# Patient Record
Sex: Male | Born: 1965 | Race: White | Hispanic: No | Marital: Married | Smoking: Former smoker
Health system: Southern US, Community
[De-identification: ages and names within clinical notes are randomized; demographics above are authoritative.]

## PROBLEM LIST (undated history)

## (undated) DIAGNOSIS — E119 Type 2 diabetes mellitus without complications: Secondary | ICD-10-CM

## (undated) DIAGNOSIS — T148XXA Other injury of unspecified body region, initial encounter: Secondary | ICD-10-CM

## (undated) DIAGNOSIS — I10 Essential (primary) hypertension: Secondary | ICD-10-CM

## (undated) HISTORY — PX: COLONOSCOPY W/ BIOPSIES AND POLYPECTOMY: SHX1376

## (undated) HISTORY — PX: WISDOM TOOTH EXTRACTION: SHX21

## (undated) HISTORY — DX: Type 2 diabetes mellitus without complications: E11.9

## (undated) HISTORY — PX: VASECTOMY: SHX75

## (undated) HISTORY — DX: Essential (primary) hypertension: I10

---

## 2004-04-20 ENCOUNTER — Emergency Department (HOSPITAL_COMMUNITY): Admission: EM | Admit: 2004-04-20 | Discharge: 2004-04-20 | Payer: Self-pay | Admitting: Family Medicine

## 2009-08-12 ENCOUNTER — Encounter: Admission: RE | Admit: 2009-08-12 | Discharge: 2009-08-12 | Payer: Self-pay | Admitting: Family Medicine

## 2015-08-02 ENCOUNTER — Other Ambulatory Visit: Payer: Self-pay | Admitting: Family Medicine

## 2015-08-02 ENCOUNTER — Ambulatory Visit
Admission: RE | Admit: 2015-08-02 | Discharge: 2015-08-02 | Disposition: A | Discharge status: Home / Self Care | Payer: Self-pay | Source: Ambulatory Visit | Attending: Family Medicine | Admitting: Family Medicine

## 2015-08-02 DIAGNOSIS — R0600 Dyspnea, unspecified: Secondary | ICD-10-CM

## 2015-08-02 DIAGNOSIS — R0609 Other forms of dyspnea: Principal | ICD-10-CM

## 2016-06-16 ENCOUNTER — Ambulatory Visit: Payer: Self-pay | Admitting: Allergy

## 2016-06-29 ENCOUNTER — Ambulatory Visit (INDEPENDENT_AMBULATORY_CARE_PROVIDER_SITE_OTHER): Payer: 59 | Admitting: Allergy

## 2016-06-29 ENCOUNTER — Encounter (INDEPENDENT_AMBULATORY_CARE_PROVIDER_SITE_OTHER): Payer: Self-pay

## 2016-06-29 ENCOUNTER — Encounter: Payer: Self-pay | Admitting: Allergy

## 2016-06-29 VITALS — BP 132/86 | HR 88 | Temp 97.9°F | Resp 16 | Ht 68.0 in | Wt 229.0 lb

## 2016-06-29 DIAGNOSIS — L509 Urticaria, unspecified: Secondary | ICD-10-CM

## 2016-06-29 LAB — CBC WITH DIFFERENTIAL/PLATELET
BASOS ABS: 0 {cells}/uL (ref 0–200)
Basophils Relative: 0 %
EOS PCT: 1 %
Eosinophils Absolute: 60 cells/uL (ref 15–500)
HCT: 49 % (ref 38.5–50.0)
HEMOGLOBIN: 16.4 g/dL (ref 13.2–17.1)
LYMPHS ABS: 1380 {cells}/uL (ref 850–3900)
Lymphocytes Relative: 23 %
MCH: 29.9 pg (ref 27.0–33.0)
MCHC: 33.5 g/dL (ref 32.0–36.0)
MCV: 89.3 fL (ref 80.0–100.0)
MONOS PCT: 10 %
MPV: 9.2 fL (ref 7.5–12.5)
Monocytes Absolute: 600 cells/uL (ref 200–950)
NEUTROS ABS: 3960 {cells}/uL (ref 1500–7800)
Neutrophils Relative %: 66 %
PLATELETS: 322 10*3/uL (ref 140–400)
RBC: 5.49 MIL/uL (ref 4.20–5.80)
RDW: 13.3 % (ref 11.0–15.0)
WBC: 6 10*3/uL (ref 3.8–10.8)

## 2016-06-29 LAB — COMPLETE METABOLIC PANEL WITH GFR
ALT: 28 U/L (ref 9–46)
AST: 20 U/L (ref 10–35)
Albumin: 4.3 g/dL (ref 3.6–5.1)
Alkaline Phosphatase: 47 U/L (ref 40–115)
BUN: 11 mg/dL (ref 7–25)
CO2: 28 mmol/L (ref 20–31)
Calcium: 9.3 mg/dL (ref 8.6–10.3)
Chloride: 101 mmol/L (ref 98–110)
Creat: 0.86 mg/dL (ref 0.70–1.33)
GFR, Est African American: >89 mL/min (ref >=60)
GLUCOSE: 134 mg/dL — AB (ref 65–99)
POTASSIUM: 4.6 mmol/L (ref 3.5–5.3)
SODIUM: 138 mmol/L (ref 135–146)
TOTAL PROTEIN: 6.8 g/dL (ref 6.1–8.1)
Total Bilirubin: 0.6 mg/dL (ref 0.2–1.2)

## 2016-06-29 MED ORDER — FEXOFENADINE HCL 180 MG PO TABS
180.0000 mg | ORAL_TABLET | Freq: Two times a day (BID) | ORAL | 2 refills | Status: DC
Start: 2016-06-29 — End: 2016-10-11

## 2016-06-29 MED ORDER — RANITIDINE HCL 150 MG PO TABS: 150.0000 mg | ORAL_TABLET | Freq: Two times a day (BID) | ORAL | 2 refills | Status: AC

## 2016-06-29 MED ORDER — MONTELUKAST SODIUM 10 MG PO TABS: 10.0000 mg | ORAL_TABLET | Freq: Every day | ORAL | 2 refills | Status: DC

## 2016-06-29 NOTE — Progress Notes (Signed)
New Patient Note  RE: Jason ManyCharles Bernard Stallone MRN: [id number] DOB: 10-03-1965 Date of Office Visit: 06/29/2016  Referring provider: Maurice SmallGriffin, Elaine, MD Primary care provider: Thora LanceEHINGER,ROBERT R, MD  Chief Complaint: rash  History of present illness: Jason Lawrence is a 50 y.o. male presenting today for consultation for rash.   He reports he has had "skin irritation" that he described as hives.   Rash started in April 2016.   He has had rash from his head down to his knees.  The worst area is in his neck and upper chest area.  He reports having rash almost near daily.   Rash is itchy and described as red, raised bumps.   He reports when it was hot over the summer and warm/hot showers made the rash worse.  Denies nay worsening of rash at pressure points or with vibrations.  No marks/bruising, no joint aches/pains, swellings.  No fevers.    He denies any new medications, foods, stings, soaps/detergent/lotions.  He did change to dye free detergent but that did not make any differences.  He stopped lisinopril for 2 weeks and did not have any resolution of the rash either.    He saw dermatologist initially who advised Allegra and he takes between 2-4 tabs a day.   He also had triamcinolone cream that he has been using as needed.   He does feel the allegra had eased the itchiness.   He has also been given 2 kenalog shots to help with the rash.    He reports more sneezing episodes in the evenings and more work.  Otherwise denies any significant nasal or ocular symptoms suggestive of allergic rhinoconjunctivitis.  He denies a history of asthma, eczema or food allergy.    Review of systems: Review of Systems  Constitutional: Negative for chills, fever and malaise/fatigue.  HENT: Negative for congestion, ear pain, nosebleeds, sinus pain and sore throat.   Eyes: Negative for discharge and redness.  Respiratory: Negative for cough, shortness of breath and wheezing.   Cardiovascular: Negative for  chest pain.  Gastrointestinal: Negative for heartburn, nausea and vomiting.  Musculoskeletal: Negative for joint pain.  Skin: Positive for itching and rash.    All other systems negative unless noted above in HPI  Past medical history: Past Medical History:  Diagnosis Date  . Diabetes (HCC)   . Hypertension     Past surgical history: Past Surgical History:  Procedure Laterality Date  . VASECTOMY    . WISDOM TOOTH EXTRACTION      Family history:  Family History  Problem Relation Age of Onset  . Allergic rhinitis Neg Hx   . Angioedema Neg Hx   . Asthma Neg Hx   . Eczema Neg Hx   . Immunodeficiency Neg Hx   . Urticaria Neg Hx     Social history: He lives in a home with carpeting with one cat. There is gas heating and central cooling in the home. There is no concern for water damage or mildew or roaches in the home. She works as a IT trainerservice customer care representative.    He has a smoking history from [id number] and quit in 1989 he smoked one pack of cigarettes/day during this time.   Medication List: Allergies as of 06/29/2016   No Known Allergies     Medication List       Accurate as of 06/29/16  9:54 AM. Always use your most recent med list.  clotrimazole 1 % cream Commonly known as:  LOTRIMIN Apply 1 application topically 2 (two) times daily.   fexofenadine 180 MG tablet Commonly known as:  ALLEGRA ALLERGY Take 1 tablet (180 mg total) by mouth 2 (two) times daily.   lisinopril 10 MG tablet Commonly known as:  PRINIVIL,ZESTRIL Take 10 mg by mouth daily.   montelukast 10 MG tablet Commonly known as:  SINGULAIR Take 1 tablet (10 mg total) by mouth at bedtime.   ranitidine 150 MG tablet Commonly known as:  ZANTAC Take 1 tablet (150 mg total) by mouth 2 (two) times daily.   triamcinolone cream 0.1 % Commonly known as:  KENALOG Apply 1 application topically 2 (two) times daily.       Known medication allergies: No Known Allergies   Physical  examination: Blood pressure 132/86, pulse 88, temperature 97.9 F (36.6 C), temperature source Oral, resp. rate 16, height 5\' 8"  (1.727 m), weight 229 lb (103.9 kg), SpO2 97 %.  General: Alert, interactive, in no acute distress. HEENT: TMs pearly gray, turbinates non-edematous without discharge, post-pharynx non erythematous. Neck: Supple without lymphadenopathy. Lungs: Clear to auscultation without wheezing, rhonchi or rales. {no increased work of breathing. CV: Normal S1, S2 without murmurs. Abdomen: Nondistended, nontender. Skin: Scattered erythematous urticarial type lesions primarily located in upper left chest and on dorsal surface of right hand , nonvesicular. Extremities:  No clubbing, cyanosis or edema. Neuro:   Grossly intact.  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   Description and appearance of this rash appears to be consistent with urticaria.  Typically with urticaria that has been chronic there is no identifiable trigger.  He does note that hot temperatures worsen his rash which is common especially with cholinergic-type urticaria.      - will obtain the following labs:  CBC w diff, CMP, tryptase level, TSH, chronic hive panel     - start the following medication regimen:   Allegra 180mg  twice a day, Zantac 150mg  twice a day and Singulair 10mg  at bedtime      - let us know if you have fevers, swelling, increased joint pain/ache or any bruising associated with your hives.     Follow-up 2-3 months  I appreciate the opportunity to take part in Teigen's care. Please do not hesitate to contact me with questions.  Sincerely,   Margo AyeShaylar Maki Sweetser, MD Allergy/Immunology [facility]

## 2016-06-29 NOTE — Patient Instructions (Signed)
Hives without swelling      - at this time no identifiable trigger.   Usually there is no identifiable trigger and it is not related to foods, medications or body products you are using.         - will obtain the following labs:  CBC w diff, CMP, tryptase level, TSH, chronic hive panel     - start the following medication regimen:   Allegra 180mg  twice a day, Zantac 150mg  twice a day and Singulair 10mg  at bedtime      - let us know if you have fevers, swelling, increased joint pain/ache or any bruising associated with your hives.     Follow-up 2-3 months

## 2016-06-30 LAB — CP584 ZONE 3
ALLERGEN, C. HERBARUM, M2: 0.22 kU/L — AB
ALLERGEN, P. NOTATUM, M1: 0.36 kU/L — AB
Allergen, A. alternata, m6: 4.22 kU/L — ABNORMAL HIGH
Allergen, Mucor Racemosus, M4: <0.1 kU/L
Allergen, Mulberry, t76: <0.1 kU/L
Allergen, S. Botryosum, m10: 0.64 kU/L — ABNORMAL HIGH
Aspergillus fumigatus, m3: 0.16 kU/L — ABNORMAL HIGH
Bahia Grass: <0.1 kU/L
Box Elder IgE: <0.1 kU/L
Cockroach: <0.1 kU/L
Common Ragweed: <0.1 kU/L
Johnson Grass: <0.1 kU/L
MEADOW GRASS: 0.2 kU/L — AB
Nettle: <0.1 kU/L
Pecan/Hickory Tree IgE: <0.1 kU/L
Plantain: <0.1 kU/L
Rough Pigweed  IgE: 0.24 kU/L — ABNORMAL HIGH

## 2016-06-30 LAB — TRYPTASE: Tryptase: 3.1 ug/L (ref <11)

## 2016-07-05 LAB — CP CHRONIC URTICARIA INDEX PANEL
Histamine Release: <16 % (ref <16)
THYROID PEROXIDASE ANTIBODY: 2 [IU]/mL (ref <9)
TSH: 1.32 mIU/L (ref 0.40–4.50)

## 2016-07-06 ENCOUNTER — Telehealth: Payer: Self-pay | Admitting: Allergy

## 2016-07-06 NOTE — Telephone Encounter (Signed)
Informed patient that once the doctor looks at his lab work we will call him with the results.

## 2016-07-06 NOTE — Telephone Encounter (Signed)
Patient saw Dr. Delorse LekPadgett on 12-28 and had blood work done. He is checking on the results.

## 2016-09-15 ENCOUNTER — Encounter: Payer: Self-pay | Admitting: Allergy

## 2016-09-15 ENCOUNTER — Ambulatory Visit (INDEPENDENT_AMBULATORY_CARE_PROVIDER_SITE_OTHER): Payer: 59 | Admitting: Allergy

## 2016-09-15 VITALS — BP 128/80 | HR 80 | Resp 16

## 2016-09-15 DIAGNOSIS — L508 Other urticaria: Secondary | ICD-10-CM | POA: Diagnosis not present

## 2016-09-15 MED ORDER — OMALIZUMAB 150 MG ~~LOC~~ SOLR
300.0000 mg | SUBCUTANEOUS | Status: AC
  Administered 2016-09-15 – 2016-11-10 (×3): 300 mg via SUBCUTANEOUS

## 2016-09-15 NOTE — Patient Instructions (Signed)
Hives without swelling      -  Some improvement on high dose antihistamine regimen however still with weekly symptoms.   We will go ahead with Xolair approval.   We do have samples and advise you return to office at 1:30 to receive sample injection and wait for 1 hour.   Xolair will be every 4 week injections.   Will provide with epinephrine device to bring on the days of your shot.        - continue current medication regimen:   Allegra 180mg  twice a day (may increase to 2 tabs twice a day), Zantac 150mg  twice a day and Singulair 10mg  at bedtime      - let us know if you have fevers, swelling, increased joint pain/ache or any bruising associated with your hives.     Follow-up 6 months

## 2016-09-15 NOTE — Progress Notes (Signed)
Follow-up Note  RE: Jason Lawrence MRN: [id number] DOB: Apr 17, 1966 Date of Office Visit: 09/15/2016   History of present illness: Jason Lawrence is a 51 y.o. male presenting today for follow-up of hives. He was last seen in the office for initial evaluation on 06/29/2016 by myself. At that time was recommended he take Allegra twice a day, Zantac twice a day and Singulair at bedtime which she has been doing. Reports some improvements with this medication regimen as he is now down to 1-3 times a week that he has hives episodes versus daily previously. Otherwise he reports he is doing well.     Review of systems: Review of Systems  Constitutional: Negative for chills, fever and malaise/fatigue.  HENT: Negative for congestion, ear discharge, ear pain, sinus pain, sore throat and tinnitus.   Eyes: Negative for discharge and redness.  Respiratory: Negative for cough, shortness of breath and wheezing.   Cardiovascular: Negative for chest pain.  Gastrointestinal: Negative for abdominal pain, heartburn, nausea and vomiting.  Musculoskeletal: Negative for joint pain and myalgias.  Skin: Positive for itching and rash.  Neurological: Negative for headaches.    All other systems negative unless noted above in HPI  Past medical/social/surgical/family history have been reviewed and are unchanged unless specifically indicated below.  No changes  Medication List: Allergies as of 09/15/2016   No Known Allergies     Medication List       Accurate as of 09/15/16 11:29 AM. Always use your most recent med list.          clotrimazole 1 % cream Commonly known as:  LOTRIMIN Apply 1 application topically 2 (two) times daily.   fexofenadine 180 MG tablet Commonly known as:  ALLEGRA ALLERGY Take 1 tablet (180 mg total) by mouth 2 (two) times daily.   lisinopril 10 MG tablet Commonly known as:  PRINIVIL,ZESTRIL Take 10 mg by mouth daily.   montelukast 10 MG tablet Commonly  known as:  SINGULAIR Take 1 tablet (10 mg total) by mouth at bedtime.   ranitidine 150 MG tablet Commonly known as:  ZANTAC Take 1 tablet (150 mg total) by mouth 2 (two) times daily.   triamcinolone cream 0.1 % Commonly known as:  KENALOG Apply 1 application topically 2 (two) times daily.       Known medication allergies: No Known Allergies   Physical examination: Blood pressure 128/80, pulse 80, resp. rate 16.  General: Alert, interactive, in no acute distress. HEENT: TMs pearly gray, turbinates minimally edematous without discharge, post-pharynx non erythematous. Neck: Supple without lymphadenopathy. Lungs: Clear to auscultation without wheezing, rhonchi or rales. {no increased work of breathing. CV: Normal S1, S2 without murmurs. Abdomen: Nondistended, nontender. Skin: Warm and dry, without lesions or rashes. Extremities:  No clubbing, cyanosis or edema. Neuro:   Grossly intact.  Diagnositics/Labs: Labs:  Component     Latest Ref Rng & Units 06/29/2016  Allergen, D pternoyssinus,d7     kU/L <0.10  D. farinae     kU/L <0.10  Cat Dander     kU/L <0.10  Dog Dander     kU/L <0.10  [place] Grass     kU/L <0.10  Meadow Grass     kU/L 0.20 (H)  Johnson Grass     kU/L <0.10  B[place]ia Grass     kU/L <0.10  Cockroach     kU/L <0.10  Allergen, P. notatum, m1     kU/L 0.36 (H)  Allergen, C. Herbarum, M2  kU/L 0.22 (H)  Aspergillus fumigatus, m3     kU/L 0.16 (H)  Allergen, Mucor Racemosus, M4     kU/L <0.10  Allergen, A. alternata, m6     kU/L 4.22 (H)  Allergen, S. Botryosum, m10     kU/L 0.64 (H)  Box Elder IgE     kU/L <0.10  Allergen, Comm Silver Charletta Cousin, t9     kU/L <0.10  Allergen, Cedar tree, t12     kU/L <0.10  Allergen, Oak,t7     kU/L <0.10  Elm IgE     kU/L <0.10  Pecan/Hickory Tree IgE     kU/L <0.10  Allergen, Mulberry, t76     kU/L <0.10  Common Ragweed     kU/L <0.10  Plantain     kU/L <0.10  Rough Pigweed  IgE     kU/L 0.24 (H)    Nettle     kU/L <0.10  Allergen, Black Locust, Acacia9     kU/L <0.10   Component     Latest Ref Rng & Units 06/29/2016  TSH     0.40 - 4.50 mIU/L 1.32  Thyroperoxidase Ab SerPl-aCnc     <9 IU/mL 2  Thyroglobulin Ab     <2 IU/mL <1  Histamine Release     <16 % <16   Component     Latest Ref Rng & Units 06/29/2016  Tryptase     <11 ug/L 3.1   Component     Latest Ref Rng & Units 06/29/2016  Sodium     135 - 146 mmol/L 138  Potassium     3.5 - 5.3 mmol/L 4.6  Chloride     98 - 110 mmol/L 101  CO2     20 - 31 mmol/L 28  Glucose     65 - 99 mg/dL 161 (H)  BUN     7 - 25 mg/dL 11  Creatinine     0.96 - 1.33 mg/dL 0.45  Total Bilirubin     0.2 - 1.2 mg/dL 0.6  Alkaline Phosphatase     40 - 115 U/L 47  AST     10 - 35 U/L 20  ALT     9 - 46 U/L 28  Total Protein     6.1 - 8.1 g/dL 6.8  Albumin     3.6 - 5.1 g/dL 4.3  Calcium     8.6 - 10.3 mg/dL 9.3  GFR, Est African American     >=60 mL/min >89  GFR, Est Non African American     >=60 mL/min >89   Component     Latest Ref Rng & Units 06/29/2016  WBC     3.8 - 10.8 K/uL 6.0  RBC     4.20 - 5.80 MIL/uL 5.49  Hemoglobin     13.2 - 17.1 g/dL 40.9  HCT     81.1 - 91.4 % 49.0  MCV     80.0 - 100.0 fL 89.3  MCH     27.0 - 33.0 pg 29.9  MCHC     32.0 - 36.0 g/dL 78.2  RDW     95.6 - 21.3 % 13.3  Platelets     140 - 400 K/uL 322  MPV     7.5 - 12.5 fL 9.2  NEUT#     1,500 - 7,800 cells/uL 3,960  Lymphocyte #     850 - 3,900 cells/uL 1,380  Monocyte #     200 - 950 cells/uL  600  Eosinophils Absolute     15 - 500 cells/uL 60  Basophils Absolute     0 - 200 cells/uL 0  Neutrophils     % 66  Lymphocytes     % 23  Monocytes Relative     % 10  Eosinophil     % 1  Basophil     % 0   Assessment and plan: 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   We will go ahead with Xolair approval.   We do have samples  and advise you return to office at 1:30 to receive sample injection with 1 hour observation period.   Xolair will be every 4 week injections.   Will provide with epinephrine device to bring on the days of your shot.        - continue current medication regimen:   Allegra 180mg  twice a day (may increase to 2 tabs twice a day), Zantac 150mg  twice a day and Singulair 10mg  at bedtime      - let us know if you have fevers, swelling, increased joint pain/ache or any bruising associated with your hives.     Follow-up 6 months  I appreciate the opportunity to take part in Cassiel's care. Please do not hesitate to contact me with questions.  Sincerely,   Margo Aye, MD Allergy/Immunology [facility] [place]

## 2016-09-18 ENCOUNTER — Telehealth: Payer: Self-pay | Admitting: Allergy

## 2016-09-18 ENCOUNTER — Other Ambulatory Visit: Payer: Self-pay

## 2016-09-18 MED ORDER — EPINEPHRINE 0.3 MG/0.3ML IJ SOAJ: 0.3000 mg | Freq: Once | INTRAMUSCULAR | 1 refills | Status: DC

## 2016-09-18 NOTE — Telephone Encounter (Signed)
Called patient, spoke to Mr. Jason Lawrence and he was wondering if we called in the Auvi-q. He was seen 09/15/16 by Dr.Padgett and the Auvi-q was discussed and was showed how it was used. I informed I sent in the Auvi-q today and if he did not hear anything in 24 hours to call [phone]. Patient understood.

## 2016-09-18 NOTE — Telephone Encounter (Signed)
Patient states they were seen by DR PADGETT and were offered and alternative to an EPI-EPN Patient is wondering if the script was being called in or if they have to pick it up? [facility] [place] [facility] [address] [place]

## 2016-09-25 ENCOUNTER — Other Ambulatory Visit: Payer: Self-pay

## 2016-09-25 DIAGNOSIS — L509 Urticaria, unspecified: Secondary | ICD-10-CM

## 2016-09-25 MED ORDER — MONTELUKAST SODIUM 10 MG PO TABS: 10.0000 mg | ORAL_TABLET | Freq: Every day | ORAL | 1 refills | Status: DC

## 2016-10-11 ENCOUNTER — Other Ambulatory Visit: Payer: Self-pay

## 2016-10-11 DIAGNOSIS — L509 Urticaria, unspecified: Secondary | ICD-10-CM

## 2016-10-11 MED ORDER — FEXOFENADINE HCL 180 MG PO TABS: 180.0000 mg | ORAL_TABLET | Freq: Two times a day (BID) | ORAL | 1 refills | Status: DC

## 2016-10-13 ENCOUNTER — Ambulatory Visit (INDEPENDENT_AMBULATORY_CARE_PROVIDER_SITE_OTHER): Payer: 59 | Admitting: *Deleted

## 2016-10-13 ENCOUNTER — Ambulatory Visit: Payer: Self-pay

## 2016-10-13 DIAGNOSIS — L500 Allergic urticaria: Secondary | ICD-10-CM | POA: Diagnosis not present

## 2016-11-10 ENCOUNTER — Ambulatory Visit (INDEPENDENT_AMBULATORY_CARE_PROVIDER_SITE_OTHER): Payer: 59

## 2016-11-10 DIAGNOSIS — L500 Allergic urticaria: Secondary | ICD-10-CM

## 2016-11-12 ENCOUNTER — Other Ambulatory Visit: Payer: Self-pay | Admitting: Allergy

## 2016-11-12 DIAGNOSIS — L509 Urticaria, unspecified: Secondary | ICD-10-CM

## 2016-11-13 NOTE — Telephone Encounter (Signed)
RF for Allegra 180 mg BID x 5 at [facility]

## 2016-12-08 ENCOUNTER — Ambulatory Visit (INDEPENDENT_AMBULATORY_CARE_PROVIDER_SITE_OTHER): Payer: 59

## 2016-12-08 DIAGNOSIS — L501 Idiopathic urticaria: Secondary | ICD-10-CM

## 2016-12-08 MED ORDER — OMALIZUMAB 150 MG ~~LOC~~ SOLR: 300.0000 mg | SUBCUTANEOUS | 5 refills | Status: DC

## 2016-12-11 MED ORDER — OMALIZUMAB 150 MG ~~LOC~~ SOLR
300.0000 mg | SUBCUTANEOUS | Status: AC
  Administered 2016-12-08: 300 mg via SUBCUTANEOUS

## 2016-12-11 NOTE — Addendum Note (Signed)
Addended by: Devoria GlassingVONCANNON, Jazline Cumbee M on: 12/11/2016 05:54 PM   Modules accepted: Orders

## 2017-06-13 ENCOUNTER — Other Ambulatory Visit: Payer: Self-pay | Admitting: Family Medicine

## 2017-06-13 ENCOUNTER — Ambulatory Visit
Admission: RE | Admit: 2017-06-13 | Discharge: 2017-06-13 | Disposition: A | Discharge status: Home / Self Care | Payer: 59 | Source: Ambulatory Visit | Attending: Family Medicine | Admitting: Family Medicine

## 2017-06-13 DIAGNOSIS — S99921A Unspecified injury of right foot, initial encounter: Secondary | ICD-10-CM

## 2017-06-14 ENCOUNTER — Other Ambulatory Visit (INDEPENDENT_AMBULATORY_CARE_PROVIDER_SITE_OTHER): Payer: Self-pay | Admitting: Family

## 2017-06-14 ENCOUNTER — Encounter (INDEPENDENT_AMBULATORY_CARE_PROVIDER_SITE_OTHER): Payer: Self-pay | Admitting: Orthopedic Surgery

## 2017-06-14 ENCOUNTER — Other Ambulatory Visit: Payer: Self-pay

## 2017-06-14 ENCOUNTER — Ambulatory Visit (INDEPENDENT_AMBULATORY_CARE_PROVIDER_SITE_OTHER): Payer: 59 | Admitting: Orthopedic Surgery

## 2017-06-14 ENCOUNTER — Encounter (HOSPITAL_COMMUNITY): Payer: Self-pay | Admitting: *Deleted

## 2017-06-14 DIAGNOSIS — S93324A Dislocation of tarsometatarsal joint of right foot, initial encounter: Secondary | ICD-10-CM

## 2017-06-14 NOTE — Progress Notes (Signed)
Pt denies SOB, chest pain, and being under the care of a cardiologist. Pt denies having a stress test , echo and cardiac cath. Pt denies having an EKG and chest x ray within the lat year. Pt denies recent labs. PT made aware to stop taking  Aspirin ( don't take) vitamins, fish oil and herbal medications. Do not take any NSAIDs ie: Ibuprofen, Advil, Naproxen, (Aleve), Motrin, BC and Goody Powder or any medication containing Aspirin. Pt made aware to not take Metformin DOS. Pt made aware to check BG every 2 hours prior to arrival to hospital on DOS. Pt made aware to treat a BG < 70 with  4 ounces of apple or cranberry juice, wait 15 minutes after intervention to recheck BG, if BG remains < 70, call Short Stay unit to speak with a nurse. Pt verbalized understanding of al  Pre-op instructions.

## 2017-06-14 NOTE — Progress Notes (Signed)
Office Visit Note   Patient: Jason Lawrence           Date of Birth: 06-May-1966           MRN: [id number] Visit Date: 06/14/2017              Requested by: Blair HeysEhinger, Robert, MD [address] [place], [place] [address] PCP: Blair HeysEhinger, Robert, MD  Chief Complaint  Patient presents with  . Right Foot - Routine Post Op    Nondisplaced fracture medial cuneiform       HPI: Patient is a 51 year old gentleman who presents with an acute Lisfranc fracture dislocation of the right foot.  Patient states he slipped on the ice sustained a plantarflexion injury to the right foot with injury to the Lisfranc complex.  Patient is diabetic his most recent hemoglobin A1c is 6.7.  He states that he used to smoke but he currently does not smoke.  Assessment & Plan: Visit Diagnoses:  1. Lisfranc dislocation, right, initial encounter     Plan: Recommended proceeding with open reduction internal fixation of the Lisfranc fracture dislocation.  The Lisfranc complex is widened he has an unstable ligamentous injury from the base of the first metatarsal to the medial cuneiform as well as a fracture through the medial cuneiform.  Discussed that this most likely will not heal without internal fixation.  Discussed that he is at a higher risk with surgery but feel that his risk of infection nonunion risk for DVT is minimal if he proceeds with the proper postoperative care of nonweightbearing and elevation.  The patient and his wife after agreeing to proceed with surgery has decided to not proceed with surgery at this time he will be placed in a fracture boot, crutches, elevation nonweightbearing and will reevaluate in the office in 3 weeks.  I reinforced the importance of strict elevation with his foot level with a heart or higher strict nonweightbearing and wearing the compression to minimize risks of blistering.  Patient was given Cheryl's card in case they wanted to proceed with surgery as  originally scheduled.  Follow-Up Instructions: Return in about 1 week (around 06/21/2017).   Ortho Exam  Patient is alert, oriented, no adenopathy, well-dressed, normal affect, normal respiratory effort. Examination patient does have venostasis swelling in the right lower extremity he has a good dorsalis pedis pulse there is no ecchymosis no bruising no blistering.  With distraction across the Lisfranc complex this reproduces pain across the Lisfranc complex as well as across the base of the first metatarsal medial cuneiform distraction across the metatarsal head reproduces pain at the base of the first metatarsal.  Patient also has pain at the Lisfranc complex with palpation over the second metatarsal head.  Review of the radiographs shows widening across the Lisfranc complex as well as a fracture through the base of the first metatarsal medial cuneiform complex.  There is no elevation or depression.  Imaging: Dg Foot Complete Right  Result Date: 06/13/2017 CLINICAL DATA:  Metatarsal pain in the right foot. Fall on ice 3 days ago. These results will be called to the ordering clinician or representative by the Radiology Department at the imaging location. EXAM: RIGHT FOOT COMPLETE - 3+ VIEW COMPARISON:  None. FINDINGS: Nondisplaced transverse fracture through the distal aspect of the medial cuneiform. No joint subluxation or dislocation noted. Bony density between the bases of the first and second metatarsals appears corticated and is most consistent with os intermetarseum. Soft tissue swelling about the  mid and forefoot. IMPRESSION: Nondisplaced fracture of the medial cuneiform. Electronically Signed   By: Marnee SpringJonathon  Watts M.D.   On: 06/13/2017 13:15   No images are attached to the encounter.  Labs: No results found for: HGBA1C, ESRSEDRATE, CRP, LABURIC, REPTSTATUS, GRAMSTAIN, CULT, LABORGA  @LABSALLVALUES (HGBA1)@  There is no height or weight on file to calculate BMI.  Orders:  No orders of  the defined types were placed in this encounter.  No orders of the defined types were placed in this encounter.    Procedures: No procedures performed  Clinical Data: No additional findings.  ROS:  All other systems negative, except as noted in the HPI. Review of Systems  Objective: Vital Signs: There were no vitals taken for this visit.  Specialty Comments:  No specialty comments available.  PMFS History: Patient Active Problem List   Diagnosis Date Noted  . Lisfranc dislocation, right, initial encounter 06/14/2017   Past Medical History:  Diagnosis Date  . Diabetes (HCC)   . Hypertension     Family History  Problem Relation Age of Onset  . Allergic rhinitis Neg Hx   . Angioedema Neg Hx   . Asthma Neg Hx   . Eczema Neg Hx   . Immunodeficiency Neg Hx   . Urticaria Neg Hx     Past Surgical History:  Procedure Laterality Date  . VASECTOMY    . WISDOM TOOTH EXTRACTION     Social History   Occupational History  . Not on file  Tobacco Use  . Smoking status: Former Smoker    Packs/day: 1.00    Types: Cigarettes    Last attempt to quit: 06/29/1990    Years since quitting: 26.9  . Smokeless tobacco: Never Used  Substance and Sexual Activity  . Alcohol use: Not on file  . Drug use: Not on file  . Sexual activity: Not on file

## 2017-06-15 ENCOUNTER — Ambulatory Visit (HOSPITAL_COMMUNITY): Payer: 59 | Admitting: Certified Registered"

## 2017-06-15 ENCOUNTER — Encounter (HOSPITAL_COMMUNITY)
Admission: RE | Disposition: A | Discharge status: Home / Self Care | Payer: Self-pay | Source: Ambulatory Visit | Attending: Orthopedic Surgery

## 2017-06-15 ENCOUNTER — Ambulatory Visit (HOSPITAL_COMMUNITY)
Admission: RE | Admit: 2017-06-15 | Discharge: 2017-06-15 | Disposition: A | Discharge status: Home / Self Care | Payer: 59 | Source: Ambulatory Visit | Attending: Orthopedic Surgery | Admitting: Orthopedic Surgery

## 2017-06-15 ENCOUNTER — Encounter (HOSPITAL_COMMUNITY): Payer: Self-pay | Admitting: Anesthesiology

## 2017-06-15 DIAGNOSIS — Z87891 Personal history of nicotine dependence: Secondary | ICD-10-CM | POA: Insufficient documentation

## 2017-06-15 DIAGNOSIS — S93324A Dislocation of tarsometatarsal joint of right foot, initial encounter: Secondary | ICD-10-CM | POA: Diagnosis present

## 2017-06-15 DIAGNOSIS — E119 Type 2 diabetes mellitus without complications: Secondary | ICD-10-CM | POA: Insufficient documentation

## 2017-06-15 DIAGNOSIS — W000XXA Fall on same level due to ice and snow, initial encounter: Secondary | ICD-10-CM | POA: Diagnosis not present

## 2017-06-15 DIAGNOSIS — Z7984 Long term (current) use of oral hypoglycemic drugs: Secondary | ICD-10-CM | POA: Insufficient documentation

## 2017-06-15 DIAGNOSIS — I10 Essential (primary) hypertension: Secondary | ICD-10-CM | POA: Diagnosis not present

## 2017-06-15 DIAGNOSIS — Z79899 Other long term (current) drug therapy: Secondary | ICD-10-CM | POA: Diagnosis not present

## 2017-06-15 HISTORY — DX: Other injury of unspecified body region, initial encounter: T14.8XXA

## 2017-06-15 HISTORY — PX: ORIF ANKLE FRACTURE: SHX5408

## 2017-06-15 LAB — BASIC METABOLIC PANEL
ANION GAP: 9 (ref 5–15)
BUN: 11 mg/dL (ref 6–20)
CHLORIDE: 106 mmol/L (ref 101–111)
CO2: 22 mmol/L (ref 22–32)
Calcium: 9.4 mg/dL (ref 8.9–10.3)
Creatinine, Ser: 0.84 mg/dL (ref 0.61–1.24)
GFR calc non Af Amer: >60 mL/min (ref >60)
Glucose, Bld: 125 mg/dL — ABNORMAL HIGH (ref 65–99)
POTASSIUM: 4.2 mmol/L (ref 3.5–5.1)
Sodium: 137 mmol/L (ref 135–145)

## 2017-06-15 LAB — CBC
HEMATOCRIT: 44.7 % (ref 39.0–52.0)
Hemoglobin: 15.2 g/dL (ref 13.0–17.0)
MCH: 29.9 pg (ref 26.0–34.0)
MCHC: 34 g/dL (ref 30.0–36.0)
MCV: 87.8 fL (ref 78.0–100.0)
PLATELETS: 300 10*3/uL (ref 150–400)
RBC: 5.09 MIL/uL (ref 4.22–5.81)
RDW: 13.8 % (ref 11.5–15.5)
WBC: 6.8 10*3/uL (ref 4.0–10.5)

## 2017-06-15 LAB — GLUCOSE, CAPILLARY
Glucose-Capillary: 123 mg/dL — ABNORMAL HIGH (ref 65–99)
Glucose-Capillary: 109 mg/dL — ABNORMAL HIGH (ref 65–99)

## 2017-06-15 SURGERY — OPEN REDUCTION INTERNAL FIXATION (ORIF) ANKLE FRACTURE
Anesthesia: General | Laterality: Right

## 2017-06-15 MED ORDER — FENTANYL CITRATE (PF) 100 MCG/2ML IJ SOLN: 25.0000 ug | INTRAMUSCULAR | Status: DC | PRN

## 2017-06-15 MED ORDER — ONDANSETRON HCL 4 MG/2ML IJ SOLN: 4.0000 mg | Freq: Once | INTRAMUSCULAR | Status: DC | PRN

## 2017-06-15 MED ORDER — OXYCODONE HCL 5 MG PO TABS: 5.0000 mg | ORAL_TABLET | Freq: Once | ORAL | Status: DC | PRN

## 2017-06-15 MED ORDER — HYDROCODONE-ACETAMINOPHEN 5-325 MG PO TABS: 1.0000 | ORAL_TABLET | ORAL | 0 refills | Status: AC | PRN

## 2017-06-15 MED ORDER — FENTANYL CITRATE (PF) 100 MCG/2ML IJ SOLN
INTRAMUSCULAR | Status: AC
  Administered 2017-06-15: 100 ug
  Filled 2017-06-15: qty 2

## 2017-06-15 MED ORDER — MIDAZOLAM HCL 2 MG/2ML IJ SOLN
INTRAMUSCULAR | Status: AC
  Administered 2017-06-15: 2 mg
  Filled 2017-06-15: qty 2

## 2017-06-15 MED ORDER — FENTANYL CITRATE (PF) 250 MCG/5ML IJ SOLN
INTRAMUSCULAR | Status: AC
  Filled 2017-06-15: qty 5

## 2017-06-15 MED ORDER — PROPOFOL 10 MG/ML IV BOLUS
INTRAVENOUS | Status: AC
  Filled 2017-06-15: qty 20

## 2017-06-15 MED ORDER — LACTATED RINGERS IV SOLN
INTRAVENOUS | Status: DC
  Administered 2017-06-15: 50 mL/h via INTRAVENOUS
  Administered 2017-06-15: 14:00:00 via INTRAVENOUS

## 2017-06-15 MED ORDER — KETOROLAC TROMETHAMINE 30 MG/ML IJ SOLN: 30.0000 mg | Freq: Once | INTRAMUSCULAR | Status: DC | PRN

## 2017-06-15 MED ORDER — OXYCODONE HCL 5 MG/5ML PO SOLN: 5.0000 mg | Freq: Once | ORAL | Status: DC | PRN

## 2017-06-15 MED ORDER — FENTANYL CITRATE (PF) 250 MCG/5ML IJ SOLN
INTRAMUSCULAR | Status: DC | PRN
  Administered 2017-06-15: 50 ug via INTRAVENOUS

## 2017-06-15 MED ORDER — PHENYLEPHRINE 40 MCG/ML (10ML) SYRINGE FOR IV PUSH (FOR BLOOD PRESSURE SUPPORT)
PREFILLED_SYRINGE | INTRAVENOUS | Status: AC
  Filled 2017-06-15: qty 10

## 2017-06-15 MED ORDER — LIDOCAINE 2% (20 MG/ML) 5 ML SYRINGE
INTRAMUSCULAR | Status: AC
  Filled 2017-06-15: qty 5

## 2017-06-15 MED ORDER — ONDANSETRON HCL 4 MG/2ML IJ SOLN
INTRAMUSCULAR | Status: DC | PRN
  Administered 2017-06-15: 4 mg via INTRAVENOUS

## 2017-06-15 MED ORDER — LIDOCAINE 2% (20 MG/ML) 5 ML SYRINGE
INTRAMUSCULAR | Status: DC | PRN
  Administered 2017-06-15: 80 mg via INTRAVENOUS

## 2017-06-15 MED ORDER — ACETAMINOPHEN 160 MG/5ML PO SOLN: 325.0000 mg | ORAL | Status: DC | PRN

## 2017-06-15 MED ORDER — CEFAZOLIN SODIUM-DEXTROSE 2-4 GM/100ML-% IV SOLN
2.0000 g | INTRAVENOUS | Status: AC
  Administered 2017-06-15: 2 g via INTRAVENOUS
  Filled 2017-06-15: qty 100

## 2017-06-15 MED ORDER — CHLORHEXIDINE GLUCONATE 4 % EX LIQD: 60.0000 mL | Freq: Once | CUTANEOUS | Status: DC

## 2017-06-15 MED ORDER — ACETAMINOPHEN 325 MG PO TABS: 325.0000 mg | ORAL_TABLET | ORAL | Status: DC | PRN

## 2017-06-15 MED ORDER — ONDANSETRON HCL 4 MG/2ML IJ SOLN
INTRAMUSCULAR | Status: AC
  Filled 2017-06-15: qty 2

## 2017-06-15 MED ORDER — 0.9 % SODIUM CHLORIDE (POUR BTL) OPTIME
TOPICAL | Status: DC | PRN
  Administered 2017-06-15: 1000 mL

## 2017-06-15 MED ORDER — MIDAZOLAM HCL 2 MG/2ML IJ SOLN
INTRAMUSCULAR | Status: AC
  Filled 2017-06-15: qty 2

## 2017-06-15 MED ORDER — DEXAMETHASONE SODIUM PHOSPHATE 10 MG/ML IJ SOLN
INTRAMUSCULAR | Status: AC
  Filled 2017-06-15: qty 1

## 2017-06-15 MED ORDER — DEXAMETHASONE SODIUM PHOSPHATE 10 MG/ML IJ SOLN
INTRAMUSCULAR | Status: DC | PRN
  Administered 2017-06-15: 5 mg via INTRAVENOUS

## 2017-06-15 MED ORDER — PROPOFOL 10 MG/ML IV BOLUS
INTRAVENOUS | Status: DC | PRN
  Administered 2017-06-15: 200 mg via INTRAVENOUS

## 2017-06-15 MED ORDER — ROPIVACAINE HCL 5 MG/ML IJ SOLN
INTRAMUSCULAR | Status: DC | PRN
  Administered 2017-06-15 (×5): 5 mL via PERINEURAL

## 2017-06-15 MED ORDER — MEPERIDINE HCL 25 MG/ML IJ SOLN: 6.2500 mg | INTRAMUSCULAR | Status: DC | PRN

## 2017-06-15 MED ORDER — PHENYLEPHRINE 40 MCG/ML (10ML) SYRINGE FOR IV PUSH (FOR BLOOD PRESSURE SUPPORT)
PREFILLED_SYRINGE | INTRAVENOUS | Status: DC | PRN
  Administered 2017-06-15: 80 ug via INTRAVENOUS
  Administered 2017-06-15: 40 ug via INTRAVENOUS
  Administered 2017-06-15 (×2): 80 ug via INTRAVENOUS

## 2017-06-15 SURGICAL SUPPLY — 44 items
BANDAGE ESMARK 6X9 LF (GAUZE/BANDAGES/DRESSINGS) ×1 IMPLANT
BIT DRILL KIT 2.65MM (BIT) ×1 IMPLANT
BNDG COHESIVE 4X5 TAN STRL (GAUZE/BANDAGES/DRESSINGS) ×2 IMPLANT
BNDG COHESIVE 6X5 TAN STRL LF (GAUZE/BANDAGES/DRESSINGS) ×2 IMPLANT
BNDG ESMARK 6X9 LF (GAUZE/BANDAGES/DRESSINGS) ×2
BNDG GAUZE ELAST 4 BULKY (GAUZE/BANDAGES/DRESSINGS) ×2 IMPLANT
COVER SURGICAL LIGHT HANDLE (MISCELLANEOUS) ×2 IMPLANT
DRAPE OEC MINIVIEW 54X84 (DRAPES) ×2 IMPLANT
DRAPE U-SHAPE 47X51 STRL (DRAPES) ×2 IMPLANT
DRILL KIT 2.65MM (BIT) ×2
DRSG ADAPTIC 3X8 NADH LF (GAUZE/BANDAGES/DRESSINGS) ×2 IMPLANT
DRSG PAD ABDOMINAL 8X10 ST (GAUZE/BANDAGES/DRESSINGS) ×2 IMPLANT
DURAPREP 26ML APPLICATOR (WOUND CARE) ×2 IMPLANT
ELECT REM PT RETURN 9FT ADLT (ELECTROSURGICAL) ×2
ELECTRODE REM PT RTRN 9FT ADLT (ELECTROSURGICAL) ×1 IMPLANT
GAUZE SPONGE 4X4 12PLY STRL (GAUZE/BANDAGES/DRESSINGS) ×2 IMPLANT
GLOVE BIOGEL PI IND STRL 9 (GLOVE) ×1 IMPLANT
GLOVE BIOGEL PI INDICATOR 9 (GLOVE) ×1
GLOVE SURG ORTHO 9.0 STRL STRW (GLOVE) ×2 IMPLANT
GOWN STRL REUS W/ TWL XL LVL3 (GOWN DISPOSABLE) ×3 IMPLANT
GOWN STRL REUS W/TWL XL LVL3 (GOWN DISPOSABLE) ×3
GUIDEWIRE 1.6 (WIRE) ×1
GUIDEWIRE ORTH 220X1.6XTROC (WIRE) ×1 IMPLANT
IMPL SPEED TITAN 20X15S15 (Orthopedic Implant) ×1 IMPLANT
IMPL SPEED TITAN 20X20X20 (Orthopedic Implant) ×1 IMPLANT
IMPLANT SPEED TITAN 20X15S15 (Orthopedic Implant) ×2 IMPLANT
IMPLANT SPEED TITAN 20X20X20 (Orthopedic Implant) ×2 IMPLANT
KIT BASIN OR (CUSTOM PROCEDURE TRAY) ×2 IMPLANT
KIT ROOM TURNOVER OR (KITS) ×2 IMPLANT
MANIFOLD NEPTUNE II (INSTRUMENTS) ×2 IMPLANT
NS IRRIG 1000ML POUR BTL (IV SOLUTION) ×2 IMPLANT
PACK ORTHO EXTREMITY (CUSTOM PROCEDURE TRAY) ×2 IMPLANT
PAD ABD 8X10 STRL (GAUZE/BANDAGES/DRESSINGS) ×2 IMPLANT
PAD ARMBOARD 7.5X6 YLW CONV (MISCELLANEOUS) ×4 IMPLANT
SCREW COMPR HDLS ST 4.5X40 (Screw) ×2 IMPLANT
SCREW HEADLESS COMP 4.5X42 (Screw) ×2 IMPLANT
STAPLER VISISTAT 35W (STAPLE) IMPLANT
SUCTION FRAZIER HANDLE 10FR (MISCELLANEOUS) ×1
SUCTION TUBE FRAZIER 10FR DISP (MISCELLANEOUS) ×1 IMPLANT
SUT ETHILON 2 0 PSLX (SUTURE) ×2 IMPLANT
SUT VIC AB 2-0 CT1 27 (SUTURE) ×1
SUT VIC AB 2-0 CT1 TAPERPNT 27 (SUTURE) ×1 IMPLANT
TOWEL OR 17X26 10 PK STRL BLUE (TOWEL DISPOSABLE) ×2 IMPLANT
TUBE CONNECTING 12X1/4 (SUCTIONS) ×2 IMPLANT

## 2017-06-15 NOTE — Anesthesia Postprocedure Evaluation (Signed)
Anesthesia Post Note  Patient: Jason Lawrence  Procedure(s) Performed: OPEN REDUCTION INTERNAL FIXATION (ORIF) RIGHT LISFRANC FRACTURE (Right )     Patient location during evaluation: PACU Anesthesia Type: General Level of consciousness: awake Pain management: pain level controlled Vital Signs Assessment: post-procedure vital signs reviewed and stable Respiratory status: spontaneous breathing Cardiovascular status: stable Postop Assessment: no apparent nausea or vomiting Anesthetic complications: no    Last Vitals:  Vitals:   06/15/17 1430 06/15/17 1445  BP: 129/80 128/87  Pulse: 92 92  Resp: 14 12  Temp:    SpO2: 100% 100%    Last Pain:  Vitals:   06/15/17 1445  TempSrc:   PainSc: 0-No pain   Pain Goal: Patients Stated Pain Goal: 3 (06/15/17 1208)               Prentis Langdon JR,JOHN Susann GivensFRANKLIN

## 2017-06-15 NOTE — Anesthesia Procedure Notes (Addendum)
Anesthesia Regional Block: Popliteal block   Pre-Anesthetic Checklist: ,, timeout performed, Correct Patient, Correct Site, Correct Laterality, Correct Procedure, Correct Position, site marked, Risks and benefits discussed,  Surgical consent,  Pre-op evaluation,  At surgeon's request and post-op pain management  Laterality: Right and Lower  Prep: chloraprep       Needles:  Injection technique: Single-shot  Needle Type: Echogenic Stimulator Needle     Needle Length: 9cm  Needle Gauge: 21   Needle insertion depth: 2 cm   Additional Needles:   Procedures:,,,, ultrasound used (permanent image in chart),,,,  Narrative:  Start time: 06/15/2017 1:00 PM End time: 06/15/2017 1:06 PM Injection made incrementally with aspirations every 5 mL.  Performed by: Personally  Anesthesiologist: Leilani AbleHatchett, Shelah Heatley, MD

## 2017-06-15 NOTE — Transfer of Care (Signed)
Immediate Anesthesia Transfer of Care Note  Patient: Earl ManyCharles Bernard Dutt  Procedure(s) Performed: OPEN REDUCTION INTERNAL FIXATION (ORIF) RIGHT LISFRANC FRACTURE (Right )  Patient Location: PACU  Anesthesia Type:GA combined with regional for post-op pain  Level of Consciousness: drowsy and patient cooperative  Airway & Oxygen Therapy: Patient Spontanous Breathing and Patient connected to face mask oxygen  Post-op Assessment: Report given to RN and Post -op Vital signs reviewed and stable  Post vital signs: Reviewed and stable  Last Vitals:  Vitals:   06/15/17 1133 06/15/17 1412  BP: (!) 161/100   Pulse: (!) 105   Resp: 18   Temp: 36.7 C (!) 36.3 C  SpO2: 97%     Last Pain:  Vitals:   06/15/17 1133  TempSrc: Oral  PainSc: 7       Patients Stated Pain Goal: 3 (06/15/17 1208)  Complications: No apparent anesthesia complications

## 2017-06-15 NOTE — H&P (Signed)
Jason Lawrence is an 51 y.o. male.   Chief Complaint: Right midfoot pain HPI: Patient is a 51 year old gentleman who presents with an acute Lisfranc fracture dislocation of the right foot.  Patient states he slipped on the ice sustained a plantarflexion injury to the right foot with injury to the Lisfranc complex.  Patient is diabetic his most recent hemoglobin A1c is 6.7.  He states that he used to smoke but he currently does not smoke.    Past Medical History:  Diagnosis Date  . Diabetes (HCC)   . Fracture    lisfranc fracture dislocation  . Hypertension     Past Surgical History:  Procedure Laterality Date  . COLONOSCOPY W/ BIOPSIES AND POLYPECTOMY    . VASECTOMY    . WISDOM TOOTH EXTRACTION      Family History  Problem Relation Age of Onset  . Allergic rhinitis Neg Hx   . Angioedema Neg Hx   . Asthma Neg Hx   . Eczema Neg Hx   . Immunodeficiency Neg Hx   . Urticaria Neg Hx    Social History:  reports that he quit smoking about 26 years ago. His smoking use included cigarettes. He smoked 1.00 pack per day. he has never used smokeless tobacco. He reports that he drinks alcohol. He reports that he does not use drugs.  Allergies: No Known Allergies  No medications prior to admission.    No results found for this or any previous visit (from the past 48 hour(s)). Dg Foot Complete Right  Result Date: 06/13/2017 CLINICAL DATA:  Metatarsal pain in the right foot. Fall on ice 3 days ago. These results will be called to the ordering clinician or representative by the Radiology Department at the imaging location. EXAM: RIGHT FOOT COMPLETE - 3+ VIEW COMPARISON:  None. FINDINGS: Nondisplaced transverse fracture through the distal aspect of the medial cuneiform. No joint subluxation or dislocation noted. Bony density between the bases of the first and second metatarsals appears corticated and is most consistent with os intermetarseum. Soft tissue swelling about the mid and  forefoot. IMPRESSION: Nondisplaced fracture of the medial cuneiform. Electronically Signed   By: Marnee SpringJonathon  Watts M.D.   On: 06/13/2017 13:15    Review of Systems  All other systems reviewed and are negative.   There were no vitals taken for this visit. Physical Exam   Patient is alert, oriented, no adenopathy, well-dressed, normal affect, normal respiratory effort. Examination patient does have venostasis swelling in the right lower extremity he has a good dorsalis pedis pulse there is no ecchymosis no bruising no blistering.  With distraction across the Lisfranc complex this reproduces pain across the Lisfranc complex as well as across the base of the first metatarsal medial cuneiform distraction across the metatarsal head reproduces pain at the base of the first metatarsal.  Patient also has pain at the Lisfranc complex with palpation over the second metatarsal head.  Review of the radiographs shows widening across the Lisfranc complex as well as a fracture through the base of the first metatarsal medial cuneiform complex.  There is no elevation or depression.   Assessment/Plan 1. Lisfranc dislocation, right, initial encounter     Plan: Recommended proceeding with open reduction internal fixation of the Lisfranc fracture dislocation.  The Lisfranc complex is widened he has an unstable ligamentous injury from the base of the first metatarsal to the medial cuneiform as well as a fracture through the medial cuneiform.  Discussed that this most likely will not  heal without internal fixation.  Discussed that he is at a higher risk with surgery but feel that his risk of infection nonunion risk for DVT is minimal if he proceeds with the proper postoperative care of nonweightbearing and elevation.     Nadara MustardMarcus V Ople Girgis, MD 06/15/2017, 7:49 AM

## 2017-06-15 NOTE — Anesthesia Procedure Notes (Signed)
Procedure Name: LMA Insertion Date/Time: 06/15/2017 12:38 PM Performed by: Army FossaPulliam, Aleanna Menge Dane, CRNA Pre-anesthesia Checklist: Patient identified, Emergency Drugs available, Suction available and Patient being monitored Patient Re-evaluated:Patient Re-evaluated prior to induction Oxygen Delivery Method: Circle System Utilized Preoxygenation: Pre-oxygenation with 100% oxygen Induction Type: IV induction Ventilation: Mask ventilation without difficulty LMA: LMA with gastric port inserted LMA Size: 5.0 Number of attempts: 1 Airway Equipment and Method: Bite block Placement Confirmation: positive ETCO2 Tube secured with: Tape Dental Injury: Teeth and Oropharynx as per pre-operative assessment

## 2017-06-15 NOTE — Op Note (Signed)
06/15/2017  1:59 PM  PATIENT:  Jason Lawrence    PRE-OPERATIVE DIAGNOSIS:  Lisfranc Fracture Dislocation Right Foot  POST-OPERATIVE DIAGNOSIS:  Same  PROCEDURE:  OPEN REDUCTION INTERNAL FIXATION (ORIF) RIGHT LISFRANC FRACTURE  SURGEON:  Nadara MustardMarcus V Jillianna Stanek, MD  PHYSICIAN ASSISTANT:None ANESTHESIA:   General  PREOPERATIVE INDICATIONS:  Jason Lawrence is a  51 y.o. male with a diagnosis of Lisfranc Fracture Dislocation Right Foot who failed conservative measures and elected for surgical management.    The risks benefits and alternatives were discussed with the patient preoperatively including but not limited to the risks of infection, bleeding, nerve injury, cardiopulmonary complications, the need for revision surgery, among others, and the patient was willing to proceed.  OPERATIVE IMPLANTS: Synthes 4.5 headless cannulated screws x2 with Synthes staples x2  OPERATIVE FINDINGS: Complete disruption across the Lisfranc complex as well as across the base of the first metatarsal medial cuneiform  OPERATIVE PROCEDURE: Patient was brought to the operating room and underwent a general anesthetic.  After adequate levels of anesthesia were obtained patient's right lower extremity was prepped using DuraPrep draped into a sterile field a timeout was called.  A dorsal incision was made over the first webspace.  Blunt dissection was carried down to the base of the first metatarsal medial cuneiform.  The ligamentous complex was completely disrupted the fracture through the medial cuneiform was displaced.  The joint was debrided irrigated reduced and stabilized with a K wire.  The fracture was then fixed with intramedullary 4.5 headless cannulated screw 40 mm.  A staple was then also placed to reinforce the cannulated screw perpendicular to the screw with a 20 x 20 mm Synthes staple.  Attention was then focused across the Lisfranc ligament.  The Lisfranc complex was cleansed of the torn tissue a  towel clip was used to reduce the second metatarsal to the first metatarsal this was then stabilized with a K wire from the medial cuneiform to the base of the second metatarsal.  This was then stabilized with a headless cannulated screw.  This was also reinforced dorsally with a Synthes staple 20 x 15 mm.  C-arm fluoroscopy was used throughout the case and verified reduction.  The wound was again irrigated with normal saline skin was closed using 2-0 nylon.  A sterile dressing was applied patient was extubated taken the PACU in stable condition.   DISCHARGE PLANNING:  Antibiotic duration: Perioperative antibiotics.  Weightbearing: Nonweightbearing on the right.  Pain medication: Vicodin for pain.  Dressing care/ Wound VAC: Follow-up in 1 week to change the dressing.  Ambulatory devices: Kneeling scooter crutches nonweightbearing.  Discharge to: Home.  Follow-up: In the office 1 week post operative.

## 2017-06-15 NOTE — Anesthesia Preprocedure Evaluation (Signed)
Anesthesia Evaluation  Patient identified by MRN, date of birth, ID band Patient awake    Reviewed: Allergy & Precautions, NPO status , Patient's Chart, lab work & pertinent test results  Airway Mallampati: I       Dental no notable dental hx. (+) Teeth Intact   Pulmonary former smoker,    Pulmonary exam normal breath sounds clear to auscultation       Cardiovascular hypertension, Pt. on medications Normal cardiovascular exam Rhythm:Regular Rate:Normal     Neuro/Psych negative neurological ROS  negative psych ROS   GI/Hepatic negative GI ROS, Neg liver ROS,   Endo/Other  diabetes, Type 2, Oral Hypoglycemic Agents  Renal/GU negative Renal ROS  negative genitourinary   Musculoskeletal   Abdominal (+) + obese,   Peds  Hematology negative hematology ROS (+)   Anesthesia Other Findings   Reproductive/Obstetrics                             Anesthesia Physical Anesthesia Plan  ASA: II  Anesthesia Plan: General   Post-op Pain Management:  Regional for Post-op pain   Induction:   PONV Risk Score and Plan: Ondansetron and Treatment may vary due to age or medical condition  Airway Management Planned: LMA  Additional Equipment:   Intra-op Plan:   Post-operative Plan:   Informed Consent: I have reviewed the patients History and Physical, chart, labs and discussed the procedure including the risks, benefits and alternatives for the proposed anesthesia with the patient or authorized representative who has indicated his/her understanding and acceptance.     Plan Discussed with: CRNA and Surgeon  Anesthesia Plan Comments:         Anesthesia Quick Evaluation

## 2017-06-16 LAB — HEMOGLOBIN A1C
Hgb A1c MFr Bld: 6.8 % — ABNORMAL HIGH (ref 4.8–5.6)
Mean Plasma Glucose: 148 mg/dL

## 2017-06-18 ENCOUNTER — Encounter (HOSPITAL_COMMUNITY): Payer: Self-pay | Admitting: Orthopedic Surgery

## 2017-06-21 ENCOUNTER — Encounter (INDEPENDENT_AMBULATORY_CARE_PROVIDER_SITE_OTHER): Payer: Self-pay | Admitting: Orthopedic Surgery

## 2017-06-21 ENCOUNTER — Ambulatory Visit (INDEPENDENT_AMBULATORY_CARE_PROVIDER_SITE_OTHER): Payer: 59 | Admitting: Orthopedic Surgery

## 2017-06-21 DIAGNOSIS — S93324A Dislocation of tarsometatarsal joint of right foot, initial encounter: Secondary | ICD-10-CM

## 2017-06-21 NOTE — Progress Notes (Signed)
   Post-Op Visit Note   Patient: Jason Lawrence           Date of Birth: 12-16-1965           MRN: [id number] Visit Date: 06/21/2017 PCP: Blair HeysEhinger, Robert, MD  Chief Complaint: No chief complaint on file.   HPI:  HPI The patient is a 51 year old gentleman seen 1 week status post lisfranc dislocation repair. Has been nonweight bearing with kneeling scooter.  Ortho Exam Incision of approximately sutures minimal swelling.  There is no erythema no gaping or drainage no sign of infection.  Visit Diagnoses:  1. Lisfranc dislocation, right, initial encounter     Plan: Follow-up in the office in 2 weeks with 3 v radiographs right foot.  He may get this wet cleanse incision daily.  Apply dry dressing.  Does have medical compression stockings that he is to wear daily with direct skin contact. Remain nonweight bearing.  Follow-Up Instructions: Return in about 2 weeks (around 07/05/2017).   Imaging: No results found.  Orders:  No orders of the defined types were placed in this encounter.  No orders of the defined types were placed in this encounter.    PMFS History: Patient Active Problem List   Diagnosis Date Noted  . Lisfranc dislocation, right, initial encounter 06/14/2017   Past Medical History:  Diagnosis Date  . Diabetes (HCC)   . Fracture    lisfranc fracture dislocation  . Hypertension     Family History  Problem Relation Age of Onset  . Allergic rhinitis Neg Hx   . Angioedema Neg Hx   . Asthma Neg Hx   . Eczema Neg Hx   . Immunodeficiency Neg Hx   . Urticaria Neg Hx     Past Surgical History:  Procedure Laterality Date  . COLONOSCOPY W/ BIOPSIES AND POLYPECTOMY    . ORIF ANKLE FRACTURE Right 06/15/2017   Procedure: OPEN REDUCTION INTERNAL FIXATION (ORIF) RIGHT LISFRANC FRACTURE;  Surgeon: Nadara Mustarduda, Marcus V, MD;  Location[facility];  Service: Orthopedics;  Laterality: Right;  Marland Kitchen. VASECTOMY    . WISDOM TOOTH EXTRACTION     Social History   Occupational History   . Not on file  Tobacco Use  . Smoking status: Former Smoker    Packs/day: 1.00    Types: Cigarettes    Last attempt to quit: 06/29/1990    Years since quitting: 26.9  . Smokeless tobacco: Never Used  Substance and Sexual Activity  . Alcohol use: Yes    Frequency: Never    Comment: occasional  . Drug use: No  . Sexual activity: Not on file

## 2017-07-06 ENCOUNTER — Ambulatory Visit (INDEPENDENT_AMBULATORY_CARE_PROVIDER_SITE_OTHER): Payer: BLUE CROSS/BLUE SHIELD

## 2017-07-06 ENCOUNTER — Encounter (INDEPENDENT_AMBULATORY_CARE_PROVIDER_SITE_OTHER): Payer: Self-pay | Admitting: Family

## 2017-07-06 ENCOUNTER — Ambulatory Visit (INDEPENDENT_AMBULATORY_CARE_PROVIDER_SITE_OTHER): Payer: BLUE CROSS/BLUE SHIELD | Admitting: Family

## 2017-07-06 ENCOUNTER — Ambulatory Visit (INDEPENDENT_AMBULATORY_CARE_PROVIDER_SITE_OTHER): Payer: 59 | Admitting: Orthopedic Surgery

## 2017-07-06 DIAGNOSIS — S93324D Dislocation of tarsometatarsal joint of right foot, subsequent encounter: Secondary | ICD-10-CM

## 2017-07-06 NOTE — Progress Notes (Signed)
   Post-Op Visit Note   Patient: Jason Lawrence           Date of Birth: [birth year]           MRN: [id number] Visit Date: 07/06/2017 PCP: Blair HeysEhinger, Robert, MD  Chief Complaint:  Chief Complaint  Patient presents with  . Right Foot - Follow-up    HPI:  HPI The patient is a 52 year old gentleman seen status post lisfranc dislocation repair on 06/15/17. Has been nonweight bearing with kneeling scooter.  Ortho Exam Incision well healed with minimal swelling.  There is no erythema no gaping or drainage no sign of infection.  Visit Diagnoses:  1. Lisfranc dislocation, right, subsequent encounter     Plan: Follow-up in the office in 2 weeks with 3 v radiographs right foot.  Sutures harvested today. May begin WBAT in [facility] walker.   Follow-Up Instructions: No Follow-up on file.   Imaging: No results found.  Orders:  Orders Placed This Encounter  Procedures  . XR Foot Complete Right   No orders of the defined types were placed in this encounter.    PMFS History: Patient Active Problem List   Diagnosis Date Noted  . Lisfranc dislocation, right, initial encounter 06/14/2017   Past Medical History:  Diagnosis Date  . Diabetes (HCC)   . Fracture    lisfranc fracture dislocation  . Hypertension     Family History  Problem Relation Age of Onset  . Allergic rhinitis Neg Hx   . Angioedema Neg Hx   . Asthma Neg Hx   . Eczema Neg Hx   . Immunodeficiency Neg Hx   . Urticaria Neg Hx     Past Surgical History:  Procedure Laterality Date  . COLONOSCOPY W/ BIOPSIES AND POLYPECTOMY    . ORIF ANKLE FRACTURE Right 06/15/2017   Procedure: OPEN REDUCTION INTERNAL FIXATION (ORIF) RIGHT LISFRANC FRACTURE;  Surgeon: Nadara Mustarduda, Marcus V, MD;  [facility];  Service: Orthopedics;  Laterality: Right;  Marland Kitchen. VASECTOMY    . WISDOM TOOTH EXTRACTION     Social History   Occupational History  . Not on file  Tobacco Use  . Smoking status: Former Smoker    Packs/day: 1.00    Types:  Cigarettes    Last attempt to quit: 06/29/1990    Years since quitting: 27.0  . Smokeless tobacco: Never Used  Substance and Sexual Activity  . Alcohol use: Yes    Frequency: Never    Comment: occasional  . Drug use: No  . Sexual activity: Not on file

## 2017-07-19 ENCOUNTER — Ambulatory Visit (INDEPENDENT_AMBULATORY_CARE_PROVIDER_SITE_OTHER): Payer: BLUE CROSS/BLUE SHIELD

## 2017-07-19 ENCOUNTER — Encounter (INDEPENDENT_AMBULATORY_CARE_PROVIDER_SITE_OTHER): Payer: Self-pay | Admitting: Orthopedic Surgery

## 2017-07-19 ENCOUNTER — Ambulatory Visit (INDEPENDENT_AMBULATORY_CARE_PROVIDER_SITE_OTHER): Payer: BLUE CROSS/BLUE SHIELD | Admitting: Orthopedic Surgery

## 2017-07-19 VITALS — Ht 70.0 in | Wt 232.0 lb

## 2017-07-19 DIAGNOSIS — M79671 Pain in right foot: Secondary | ICD-10-CM

## 2017-07-19 DIAGNOSIS — S93324D Dislocation of tarsometatarsal joint of right foot, subsequent encounter: Secondary | ICD-10-CM

## 2017-07-19 NOTE — Progress Notes (Signed)
Office Visit Note   Patient: Jason Lawrence           Date of Birth: 1965-09-09           MRN: [id number] Visit Date: 07/19/2017              Requested by: Blair HeysEhinger, Robert, MD [address] [place], [place] [address] PCP: Blair HeysEhinger, Robert, MD  Chief Complaint  Patient presents with  . Right Foot - Routine Post Op    06/15/17 right ORIF Lisfranc fx      HPI: Patient presents in follow-up he is 4 weeks status post open reduction internal fixation right Lisfranc fracture dislocation he is full weightbearing in the fracture boot.  He denies any pain he has been using ice and elevation.  Assessment & Plan: Visit Diagnoses:  1. Pain in right foot   2. Lisfranc dislocation, right, subsequent encounter     Plan: Recommended scar massage with cocoa butter or Shea butter.  Recommended range of motion of the ankle continue with the fracture boot.  Patient states he does not feel safe to return to work at this time plan to reevaluate in 2 weeks with repeat three-view radiographs of the right foot.  Follow-Up Instructions: Return in about 2 weeks (around 08/02/2017).   Ortho Exam  Patient is alert, oriented, no adenopathy, well-dressed, normal affect, normal respiratory effort. Examination patient has minimal swelling in the right foot there is good wrinkling of the skin the incision is healing nicely there is no keloid formation on the scar.  There is no redness no cellulitis no signs of infection.  Imaging: Xr Foot Complete Right  Result Date: 07/19/2017 3 view radiographs of the right foot shows stable internal fixation for Lisfranc fracture dislocation through the midfoot.  No complicating features no hardware failure.  No images are attached to the encounter.  Labs: Lab Results  Component Value Date   HGBA1C 6.8 (H) 06/15/2017    @LABSALLVALUES (HGBA1)@  Body mass index is 33.29 kg/m.  Orders:  Orders Placed This Encounter  Procedures  . XR Foot  Complete Right   No orders of the defined types were placed in this encounter.    Procedures: No procedures performed  Clinical Data: No additional findings.  ROS:  All other systems negative, except as noted in the HPI. Review of Systems  Objective: Vital Signs: Ht 5\' 10"  (1.778 m)   Wt 232 lb (105.2 kg)   BMI 33.29 kg/m   Specialty Comments:  No specialty comments available.  PMFS History: Patient Active Problem List   Diagnosis Date Noted  . Lisfranc dislocation, right, initial encounter 06/14/2017   Past Medical History:  Diagnosis Date  . Diabetes (HCC)   . Fracture    lisfranc fracture dislocation  . Hypertension     Family History  Problem Relation Age of Onset  . Allergic rhinitis Neg Hx   . Angioedema Neg Hx   . Asthma Neg Hx   . Eczema Neg Hx   . Immunodeficiency Neg Hx   . Urticaria Neg Hx     Past Surgical History:  Procedure Laterality Date  . COLONOSCOPY W/ BIOPSIES AND POLYPECTOMY    . ORIF ANKLE FRACTURE Right 06/15/2017   Procedure: OPEN REDUCTION INTERNAL FIXATION (ORIF) RIGHT LISFRANC FRACTURE;  Surgeon: Nadara Mustarduda, Marcus V, MD;  [facility];  Service: Orthopedics;  Laterality: Right;  Marland Kitchen. VASECTOMY    . WISDOM TOOTH EXTRACTION     Social History  Occupational History  . Not on file  Tobacco Use  . Smoking status: Former Smoker    Packs/day: 1.00    Types: Cigarettes    Last attempt to quit: 06/29/1990    Years since quitting: 27.0  . Smokeless tobacco: Never Used  Substance and Sexual Activity  . Alcohol use: Yes    Frequency: Never    Comment: occasional  . Drug use: No  . Sexual activity: Not on file

## 2017-08-01 ENCOUNTER — Ambulatory Visit (INDEPENDENT_AMBULATORY_CARE_PROVIDER_SITE_OTHER): Payer: BLUE CROSS/BLUE SHIELD | Admitting: Orthopedic Surgery

## 2017-08-01 ENCOUNTER — Ambulatory Visit (INDEPENDENT_AMBULATORY_CARE_PROVIDER_SITE_OTHER): Payer: BLUE CROSS/BLUE SHIELD

## 2017-08-01 ENCOUNTER — Encounter (INDEPENDENT_AMBULATORY_CARE_PROVIDER_SITE_OTHER): Payer: Self-pay | Admitting: Orthopedic Surgery

## 2017-08-01 DIAGNOSIS — S93324A Dislocation of tarsometatarsal joint of right foot, initial encounter: Secondary | ICD-10-CM | POA: Diagnosis not present

## 2017-08-01 NOTE — Progress Notes (Signed)
Office Visit Note   Patient: Jason Lawrence           Date of Birth: September 11, 1965           MRN: [id number] Visit Date: 08/01/2017              Requested by: Blair Heys, MD [address] [place], [place] [address] PCP: Blair Heys, MD  Chief Complaint  Patient presents with  . Right Foot - Routine Post Op      HPI: Patient presents in follow-up he is 6 weeks status post open reduction internal fixation right Lisfranc fracture dislocation. he is full weightbearing in the fracture boot.  He denies any pain. he has been using ice and elevation.  Assessment & Plan: Visit Diagnoses:  1. Lisfranc dislocation, right, initial encounter     Plan: Recommended scar massage with cocoa butter or Shea butter.  Recommended range of motion of the ankle. Advance weight bearing in regular shoe wear. Has stiff shoe wear with him today, hiking shoe.  Patient states he feels is able to return to work on Monday with restrictions. FMLA paperwork filled out today.  Follow-Up Instructions: No Follow-up on file.   Ortho Exam  Patient is alert, oriented, no adenopathy, well-dressed, normal affect, normal respiratory effort. Examination patient has minimal swelling in the right foot there is good wrinkling of the skin. the incision is well healed.  There is no redness no cellulitis no signs of infection.  Imaging: No results found. No images are attached to the encounter.  Labs: Lab Results  Component Value Date   HGBA1C 6.8 (H) 06/15/2017    @LABSALLVALUES (HGBA1)@  There is no height or weight on file to calculate BMI.  Orders:  Orders Placed This Encounter  Procedures  . XR Foot Complete Right   No orders of the defined types were placed in this encounter.    Procedures: No procedures performed  Clinical Data: No additional findings.  ROS:  All other systems negative, except as noted in the HPI. Review of Systems  Constitutional: Negative for  chills and fever.  Musculoskeletal: Positive for arthralgias and myalgias.  Neurological: Negative for weakness and numbness.    Objective: Vital Signs: There were no vitals taken for this visit.  Specialty Comments:  No specialty comments available.  PMFS History: Patient Active Problem List   Diagnosis Date Noted  . Lisfranc dislocation, right, initial encounter 06/14/2017   Past Medical History:  Diagnosis Date  . Diabetes (HCC)   . Fracture    lisfranc fracture dislocation  . Hypertension     Family History  Problem Relation Age of Onset  . Allergic rhinitis Neg Hx   . Angioedema Neg Hx   . Asthma Neg Hx   . Eczema Neg Hx   . Immunodeficiency Neg Hx   . Urticaria Neg Hx     Past Surgical History:  Procedure Laterality Date  . COLONOSCOPY W/ BIOPSIES AND POLYPECTOMY    . ORIF ANKLE FRACTURE Right 06/15/2017   Procedure: OPEN REDUCTION INTERNAL FIXATION (ORIF) RIGHT LISFRANC FRACTURE;  Surgeon: Nadara Mustard, MD;  Location[facility] OR;  Service: Orthopedics;  Laterality: Right;  Marland Kitchen VASECTOMY    . WISDOM TOOTH EXTRACTION     Social History   Occupational History  . Not on file  Tobacco Use  . Smoking status: Former Smoker    Packs/day: 1.00    Types: Cigarettes    Last attempt to quit: 06/29/1990  Years since quitting: 27.1  . Smokeless tobacco: Never Used  Substance and Sexual Activity  . Alcohol use: Yes    Frequency: Never    Comment: occasional  . Drug use: No  . Sexual activity: Not on file

## 2017-08-06 ENCOUNTER — Telehealth (INDEPENDENT_AMBULATORY_CARE_PROVIDER_SITE_OTHER): Payer: Self-pay | Admitting: Family

## 2017-08-06 NOTE — Telephone Encounter (Signed)
Pt's wife will bring paperwork to the office today to make adjustment to FMLA. He is a return to work without restrictions with todays date and that will be added.

## 2017-08-06 NOTE — Telephone Encounter (Signed)
Patient would like a call back regarding his return to work date, please advise # [phone]

## 2017-09-21 DIAGNOSIS — E119 Type 2 diabetes mellitus without complications: Secondary | ICD-10-CM | POA: Diagnosis not present

## 2017-09-21 DIAGNOSIS — Z125 Encounter for screening for malignant neoplasm of prostate: Secondary | ICD-10-CM | POA: Diagnosis not present

## 2017-09-21 DIAGNOSIS — I10 Essential (primary) hypertension: Secondary | ICD-10-CM | POA: Diagnosis not present

## 2017-09-21 DIAGNOSIS — Z23 Encounter for immunization: Secondary | ICD-10-CM | POA: Diagnosis not present

## 2017-09-21 DIAGNOSIS — Z0000 Encounter for general adult medical examination without abnormal findings: Secondary | ICD-10-CM | POA: Diagnosis not present

## 2018-03-29 DIAGNOSIS — I10 Essential (primary) hypertension: Secondary | ICD-10-CM | POA: Diagnosis not present

## 2018-03-29 DIAGNOSIS — Z86010 Personal history of colonic polyps: Secondary | ICD-10-CM | POA: Diagnosis not present

## 2018-03-29 DIAGNOSIS — E119 Type 2 diabetes mellitus without complications: Secondary | ICD-10-CM | POA: Diagnosis not present

## 2018-03-29 DIAGNOSIS — Z23 Encounter for immunization: Secondary | ICD-10-CM | POA: Diagnosis not present

## 2018-03-29 DIAGNOSIS — J309 Allergic rhinitis, unspecified: Secondary | ICD-10-CM | POA: Diagnosis not present

## 2018-05-20 DIAGNOSIS — J069 Acute upper respiratory infection, unspecified: Secondary | ICD-10-CM | POA: Diagnosis not present

## 2018-05-20 DIAGNOSIS — J019 Acute sinusitis, unspecified: Secondary | ICD-10-CM | POA: Diagnosis not present

## 2019-01-24 DIAGNOSIS — Z0000 Encounter for general adult medical examination without abnormal findings: Secondary | ICD-10-CM | POA: Diagnosis not present

## 2019-02-19 DIAGNOSIS — I10 Essential (primary) hypertension: Secondary | ICD-10-CM | POA: Diagnosis not present

## 2019-02-19 DIAGNOSIS — E119 Type 2 diabetes mellitus without complications: Secondary | ICD-10-CM | POA: Diagnosis not present

## 2019-04-10 DIAGNOSIS — Z23 Encounter for immunization: Secondary | ICD-10-CM | POA: Diagnosis not present

## 2019-05-06 DIAGNOSIS — Z1159 Encounter for screening for other viral diseases: Secondary | ICD-10-CM | POA: Diagnosis not present

## 2019-05-09 DIAGNOSIS — Z800 Family history of malignant neoplasm of digestive organs: Secondary | ICD-10-CM | POA: Diagnosis not present

## 2019-05-09 DIAGNOSIS — Z86010 Personal history of colonic polyps: Secondary | ICD-10-CM | POA: Diagnosis not present

## 2019-05-09 DIAGNOSIS — K5730 Diverticulosis of large intestine without perforation or abscess without bleeding: Secondary | ICD-10-CM | POA: Diagnosis not present

## 2019-05-09 DIAGNOSIS — K635 Polyp of colon: Secondary | ICD-10-CM | POA: Diagnosis not present

## 2019-05-09 DIAGNOSIS — D123 Benign neoplasm of transverse colon: Secondary | ICD-10-CM | POA: Diagnosis not present

## 2019-05-09 DIAGNOSIS — D120 Benign neoplasm of cecum: Secondary | ICD-10-CM | POA: Diagnosis not present

## 2019-05-19 ENCOUNTER — Other Ambulatory Visit: Payer: Self-pay | Admitting: Family Medicine

## 2019-05-19 ENCOUNTER — Ambulatory Visit
Admission: RE | Admit: 2019-05-19 | Discharge: 2019-05-19 | Disposition: A | Discharge status: Home / Self Care | Payer: BC Managed Care – PPO | Source: Ambulatory Visit | Attending: Family Medicine | Admitting: Family Medicine

## 2019-05-19 DIAGNOSIS — S6991XA Unspecified injury of right wrist, hand and finger(s), initial encounter: Secondary | ICD-10-CM

## 2019-05-19 DIAGNOSIS — S6990XA Unspecified injury of unspecified wrist, hand and finger(s), initial encounter: Secondary | ICD-10-CM | POA: Diagnosis not present

## 2019-05-19 DIAGNOSIS — S62511A Displaced fracture of proximal phalanx of right thumb, initial encounter for closed fracture: Secondary | ICD-10-CM | POA: Diagnosis not present

## 2019-07-12 IMAGING — DX DG FOOT COMPLETE 3+V*R*
3 series · 3 of 3 positions shown · non-contrast
Comparison: None.

ADDENDUM:
Revision to history of fall on ice 3 days prior. At the time of
imaging, injury had reportedly occurred 1 day previous.
CLINICAL DATA: Metatarsal pain in the right foot. Fall on ice 3
days ago.

These results will be called to the ordering clinician or
representative by the [HOSPITAL] at the imaging location.
EXAM:
RIGHT FOOT COMPLETE - 3+ VIEW

[dg foot complete right (1 of 3)]
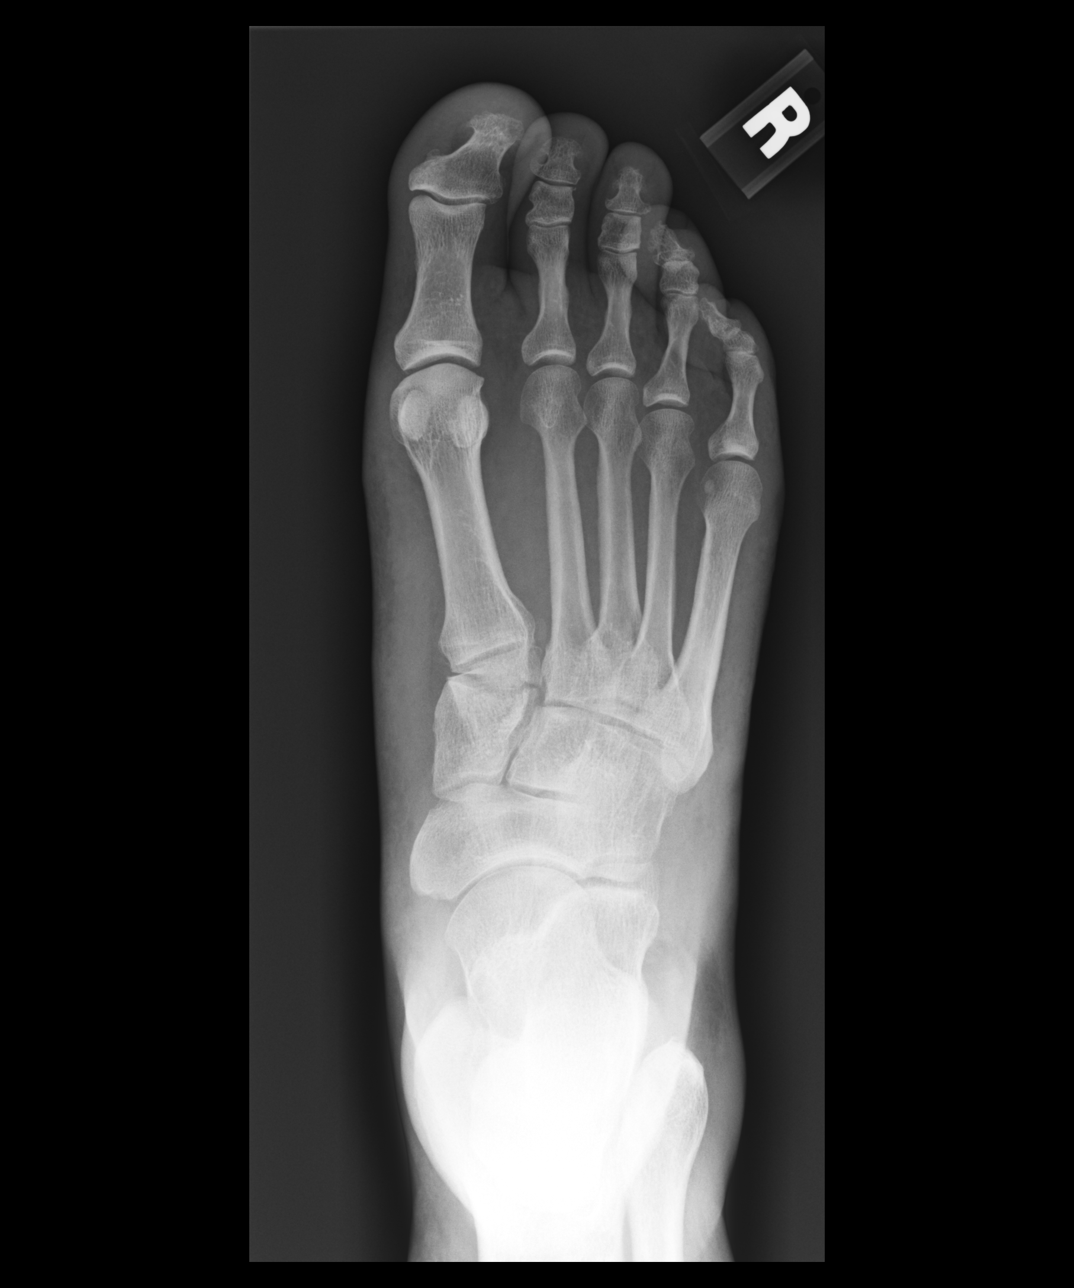

[dg foot complete right (2 of 3)]
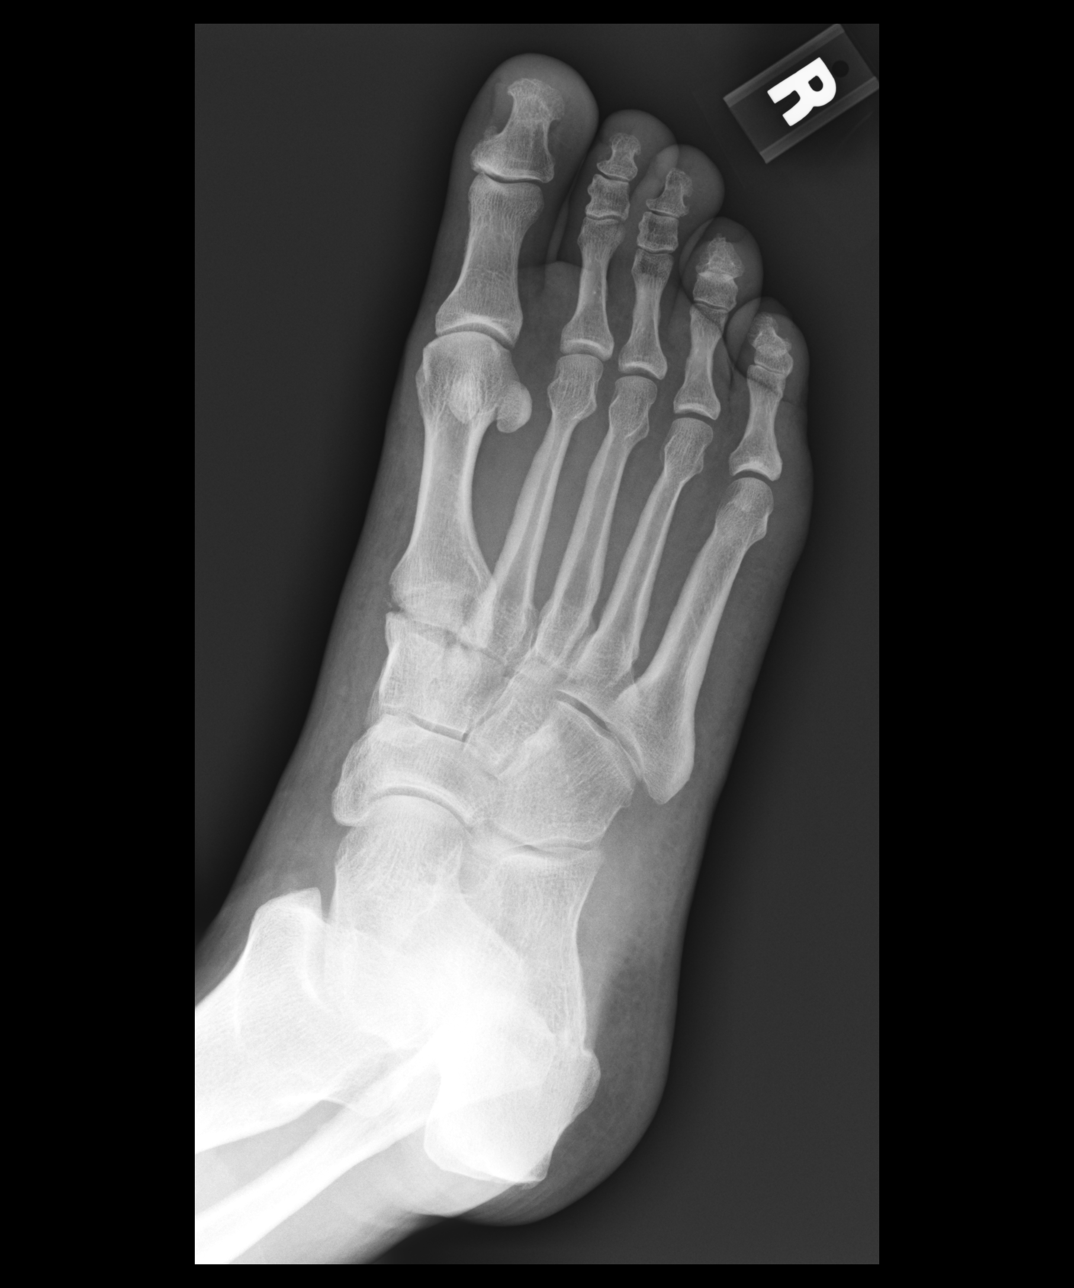

[dg foot complete right (3 of 3)]
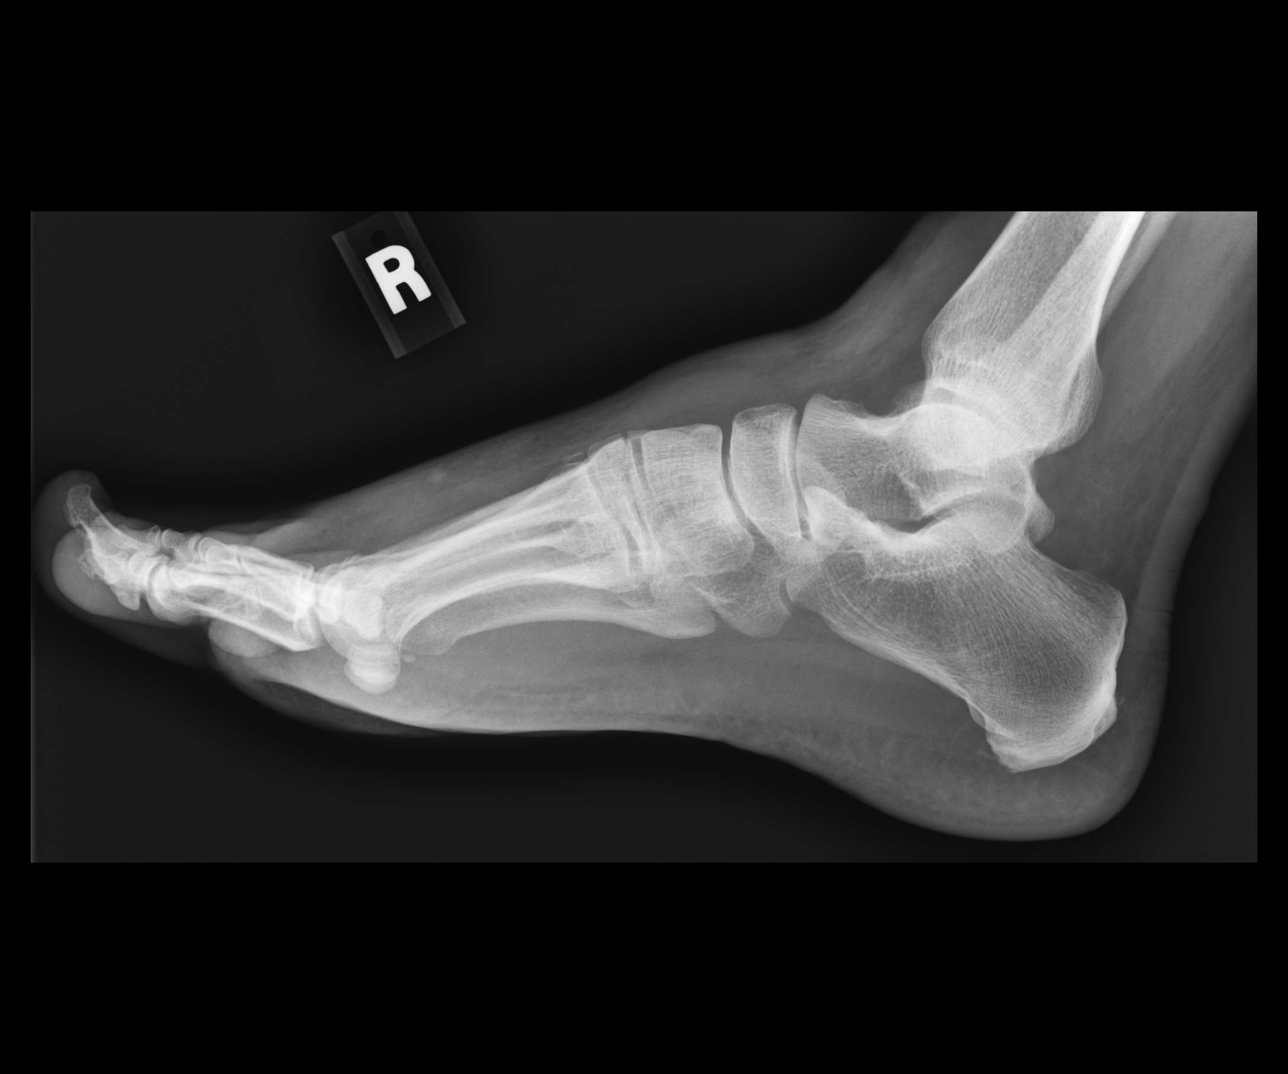

[3 of 3 positions shown; findings below may reference images not displayed]

FINDINGS: Nondisplaced transverse fracture through the distal aspect of the
medial cuneiform. No joint subluxation or dislocation noted. Bony
density between the bases of the first and second metatarsals
appears corticated and is most consistent with os intermetarseum.
Soft tissue swelling about the mid and forefoot.
IMPRESSION: Nondisplaced fracture of the medial cuneiform.

## 2019-08-08 DIAGNOSIS — I10 Essential (primary) hypertension: Secondary | ICD-10-CM | POA: Diagnosis not present

## 2019-08-08 DIAGNOSIS — E119 Type 2 diabetes mellitus without complications: Secondary | ICD-10-CM | POA: Diagnosis not present

## 2019-08-08 DIAGNOSIS — J309 Allergic rhinitis, unspecified: Secondary | ICD-10-CM | POA: Diagnosis not present

## 2019-08-08 DIAGNOSIS — N529 Male erectile dysfunction, unspecified: Secondary | ICD-10-CM | POA: Diagnosis not present

## 2019-08-29 DIAGNOSIS — E119 Type 2 diabetes mellitus without complications: Secondary | ICD-10-CM | POA: Diagnosis not present

## 2019-08-29 DIAGNOSIS — Z125 Encounter for screening for malignant neoplasm of prostate: Secondary | ICD-10-CM | POA: Diagnosis not present

## 2019-08-29 DIAGNOSIS — I10 Essential (primary) hypertension: Secondary | ICD-10-CM | POA: Diagnosis not present

## 2019-09-12 ENCOUNTER — Ambulatory Visit: Payer: BC Managed Care – PPO | Attending: Internal Medicine

## 2019-09-12 DIAGNOSIS — Z23 Encounter for immunization: Secondary | ICD-10-CM

## 2019-09-12 NOTE — Progress Notes (Signed)
   Covid-19 Vaccination Clinic  Name:  Yanni Quiroa    MRN: [id number] DOB: Dec 15, 1965  09/12/2019  Mr. Wichmann was observed post Covid-19 immunization for 15 minutes without incident. He was provided with Vaccine Information Sheet and instruction to access the V-Safe system.   Mr. Dai was instructed to call 911 with any severe reactions post vaccine: Marland Kitchen Difficulty breathing  . Swelling of face and throat  . A fast heartbeat  . A bad rash all over body  . Dizziness and weakness   Immunizations Administered    Name Date Dose VIS Date Route   Pfizer COVID-19 Vaccine 09/12/2019  4:11 PM 0.3 mL 06/13/2019 Intramuscular   Manufacturer: [employer], [employer]   Lot: [id number]   NDC: [address]-6

## 2019-09-22 DIAGNOSIS — M545 Low back pain: Secondary | ICD-10-CM | POA: Diagnosis not present

## 2019-10-06 ENCOUNTER — Ambulatory Visit: Payer: BC Managed Care – PPO

## 2019-10-06 ENCOUNTER — Ambulatory Visit: Payer: BC Managed Care – PPO | Attending: Internal Medicine

## 2019-10-06 DIAGNOSIS — Z23 Encounter for immunization: Secondary | ICD-10-CM

## 2019-10-06 NOTE — Progress Notes (Signed)
   Covid-19 Vaccination Clinic  Name:  Archimedes Harold    MRN: [id number] DOB: June 09, 1966  10/06/2019  Mr. Featherly was observed post Covid-19 immunization for 15 minutes without incident. He was provided with Vaccine Information Sheet and instruction to access the V-Safe system.   Mr. Wyne was instructed to call 911 with any severe reactions post vaccine: Marland Kitchen Difficulty breathing  . Swelling of face and throat  . A fast heartbeat  . A bad rash all over body  . Dizziness and weakness   Immunizations Administered    Name Date Dose VIS Date Route   Pfizer COVID-19 Vaccine 10/06/2019  2:21 PM 0.3 mL 06/13/2019 Intramuscular   Manufacturer: [employer], [employer]   Lot: [id number]   NDC: [address]-0

## 2020-03-05 DIAGNOSIS — I10 Essential (primary) hypertension: Secondary | ICD-10-CM | POA: Diagnosis not present

## 2020-03-05 DIAGNOSIS — Z0000 Encounter for general adult medical examination without abnormal findings: Secondary | ICD-10-CM | POA: Diagnosis not present

## 2020-03-05 DIAGNOSIS — E119 Type 2 diabetes mellitus without complications: Secondary | ICD-10-CM | POA: Diagnosis not present

## 2020-09-03 DIAGNOSIS — Z23 Encounter for immunization: Secondary | ICD-10-CM | POA: Diagnosis not present

## 2020-09-03 DIAGNOSIS — I10 Essential (primary) hypertension: Secondary | ICD-10-CM | POA: Diagnosis not present

## 2020-09-03 DIAGNOSIS — E119 Type 2 diabetes mellitus without complications: Secondary | ICD-10-CM | POA: Diagnosis not present

## 2020-09-03 DIAGNOSIS — N529 Male erectile dysfunction, unspecified: Secondary | ICD-10-CM | POA: Diagnosis not present

## 2021-03-10 DIAGNOSIS — Z23 Encounter for immunization: Secondary | ICD-10-CM | POA: Diagnosis not present

## 2021-03-10 DIAGNOSIS — Z0000 Encounter for general adult medical examination without abnormal findings: Secondary | ICD-10-CM | POA: Diagnosis not present

## 2021-03-10 DIAGNOSIS — I10 Essential (primary) hypertension: Secondary | ICD-10-CM | POA: Diagnosis not present

## 2021-03-10 DIAGNOSIS — E119 Type 2 diabetes mellitus without complications: Secondary | ICD-10-CM | POA: Diagnosis not present

## 2021-08-05 DIAGNOSIS — J329 Chronic sinusitis, unspecified: Secondary | ICD-10-CM | POA: Diagnosis not present

## 2021-08-05 DIAGNOSIS — J029 Acute pharyngitis, unspecified: Secondary | ICD-10-CM | POA: Diagnosis not present

## 2021-08-05 DIAGNOSIS — R0981 Nasal congestion: Secondary | ICD-10-CM | POA: Diagnosis not present

## 2021-08-05 DIAGNOSIS — R059 Cough, unspecified: Secondary | ICD-10-CM | POA: Diagnosis not present

## 2021-09-09 DIAGNOSIS — I10 Essential (primary) hypertension: Secondary | ICD-10-CM | POA: Diagnosis not present

## 2021-09-09 DIAGNOSIS — N529 Male erectile dysfunction, unspecified: Secondary | ICD-10-CM | POA: Diagnosis not present

## 2021-09-09 DIAGNOSIS — E119 Type 2 diabetes mellitus without complications: Secondary | ICD-10-CM | POA: Diagnosis not present

## 2021-09-09 DIAGNOSIS — Z125 Encounter for screening for malignant neoplasm of prostate: Secondary | ICD-10-CM | POA: Diagnosis not present

## 2021-11-07 DIAGNOSIS — R300 Dysuria: Secondary | ICD-10-CM | POA: Diagnosis not present

## 2021-11-07 DIAGNOSIS — R519 Headache, unspecified: Secondary | ICD-10-CM | POA: Diagnosis not present

## 2021-11-07 DIAGNOSIS — R509 Fever, unspecified: Secondary | ICD-10-CM | POA: Diagnosis not present

## 2022-04-06 DIAGNOSIS — E119 Type 2 diabetes mellitus without complications: Secondary | ICD-10-CM | POA: Diagnosis not present

## 2022-04-06 DIAGNOSIS — I10 Essential (primary) hypertension: Secondary | ICD-10-CM | POA: Diagnosis not present

## 2022-04-06 DIAGNOSIS — Z23 Encounter for immunization: Secondary | ICD-10-CM | POA: Diagnosis not present

## 2022-04-06 DIAGNOSIS — Z0000 Encounter for general adult medical examination without abnormal findings: Secondary | ICD-10-CM | POA: Diagnosis not present

## 2022-05-16 DIAGNOSIS — J309 Allergic rhinitis, unspecified: Secondary | ICD-10-CM | POA: Diagnosis not present

## 2022-08-04 DIAGNOSIS — E119 Type 2 diabetes mellitus without complications: Secondary | ICD-10-CM | POA: Diagnosis not present

## 2022-08-04 DIAGNOSIS — D3132 Benign neoplasm of left choroid: Secondary | ICD-10-CM | POA: Diagnosis not present

## 2022-10-13 DIAGNOSIS — I10 Essential (primary) hypertension: Secondary | ICD-10-CM | POA: Diagnosis not present

## 2022-10-13 DIAGNOSIS — E119 Type 2 diabetes mellitus without complications: Secondary | ICD-10-CM | POA: Diagnosis not present

## 2022-10-13 DIAGNOSIS — N529 Male erectile dysfunction, unspecified: Secondary | ICD-10-CM | POA: Diagnosis not present

## 2022-10-13 DIAGNOSIS — F411 Generalized anxiety disorder: Secondary | ICD-10-CM | POA: Diagnosis not present

## 2022-11-28 DIAGNOSIS — M7989 Other specified soft tissue disorders: Secondary | ICD-10-CM | POA: Diagnosis not present

## 2022-11-28 DIAGNOSIS — E1169 Type 2 diabetes mellitus with other specified complication: Secondary | ICD-10-CM | POA: Diagnosis not present

## 2022-11-28 DIAGNOSIS — T63441A Toxic effect of venom of bees, accidental (unintentional), initial encounter: Secondary | ICD-10-CM | POA: Diagnosis not present

## 2022-11-28 DIAGNOSIS — I10 Essential (primary) hypertension: Secondary | ICD-10-CM | POA: Diagnosis not present

## 2023-01-26 DIAGNOSIS — H1031 Unspecified acute conjunctivitis, right eye: Secondary | ICD-10-CM | POA: Diagnosis not present

## 2023-04-13 DIAGNOSIS — Z125 Encounter for screening for malignant neoplasm of prostate: Secondary | ICD-10-CM | POA: Diagnosis not present

## 2023-04-13 DIAGNOSIS — I10 Essential (primary) hypertension: Secondary | ICD-10-CM | POA: Diagnosis not present

## 2023-04-13 DIAGNOSIS — E785 Hyperlipidemia, unspecified: Secondary | ICD-10-CM | POA: Diagnosis not present

## 2023-04-13 DIAGNOSIS — Z23 Encounter for immunization: Secondary | ICD-10-CM | POA: Diagnosis not present

## 2023-04-13 DIAGNOSIS — E1169 Type 2 diabetes mellitus with other specified complication: Secondary | ICD-10-CM | POA: Diagnosis not present

## 2023-04-13 DIAGNOSIS — Z0000 Encounter for general adult medical examination without abnormal findings: Secondary | ICD-10-CM | POA: Diagnosis not present

## 2023-04-13 DIAGNOSIS — E119 Type 2 diabetes mellitus without complications: Secondary | ICD-10-CM | POA: Diagnosis not present

## 2023-05-23 DIAGNOSIS — R0981 Nasal congestion: Secondary | ICD-10-CM | POA: Diagnosis not present

## 2023-05-23 DIAGNOSIS — J0100 Acute maxillary sinusitis, unspecified: Secondary | ICD-10-CM | POA: Diagnosis not present

## 2023-05-23 DIAGNOSIS — H9203 Otalgia, bilateral: Secondary | ICD-10-CM | POA: Diagnosis not present

## 2023-06-08 DIAGNOSIS — J0140 Acute pansinusitis, unspecified: Secondary | ICD-10-CM | POA: Diagnosis not present

## 2023-07-30 DIAGNOSIS — E1169 Type 2 diabetes mellitus with other specified complication: Secondary | ICD-10-CM | POA: Diagnosis not present

## 2023-07-30 DIAGNOSIS — R519 Headache, unspecified: Secondary | ICD-10-CM | POA: Diagnosis not present

## 2023-07-30 DIAGNOSIS — I10 Essential (primary) hypertension: Secondary | ICD-10-CM | POA: Diagnosis not present

## 2023-07-30 DIAGNOSIS — J3489 Other specified disorders of nose and nasal sinuses: Secondary | ICD-10-CM | POA: Diagnosis not present

## 2023-10-12 DIAGNOSIS — D3132 Benign neoplasm of left choroid: Secondary | ICD-10-CM | POA: Diagnosis not present

## 2023-10-12 DIAGNOSIS — E119 Type 2 diabetes mellitus without complications: Secondary | ICD-10-CM | POA: Diagnosis not present

## 2023-11-02 DIAGNOSIS — Z860101 Personal history of adenomatous and serrated colon polyps: Secondary | ICD-10-CM | POA: Diagnosis not present

## 2023-11-02 DIAGNOSIS — Z800 Family history of malignant neoplasm of digestive organs: Secondary | ICD-10-CM | POA: Diagnosis not present

## 2023-11-02 DIAGNOSIS — D123 Benign neoplasm of transverse colon: Secondary | ICD-10-CM | POA: Diagnosis not present

## 2023-11-02 DIAGNOSIS — D125 Benign neoplasm of sigmoid colon: Secondary | ICD-10-CM | POA: Diagnosis not present

## 2023-11-02 DIAGNOSIS — D124 Benign neoplasm of descending colon: Secondary | ICD-10-CM | POA: Diagnosis not present

## 2023-11-02 DIAGNOSIS — Z09 Encounter for follow-up examination after completed treatment for conditions other than malignant neoplasm: Secondary | ICD-10-CM | POA: Diagnosis not present

## 2024-01-11 DIAGNOSIS — E119 Type 2 diabetes mellitus without complications: Secondary | ICD-10-CM | POA: Diagnosis not present

## 2024-01-11 DIAGNOSIS — K760 Fatty (change of) liver, not elsewhere classified: Secondary | ICD-10-CM | POA: Diagnosis not present

## 2024-01-11 DIAGNOSIS — I10 Essential (primary) hypertension: Secondary | ICD-10-CM | POA: Diagnosis not present

## 2024-01-11 DIAGNOSIS — E785 Hyperlipidemia, unspecified: Secondary | ICD-10-CM | POA: Diagnosis not present

## 2024-03-21 DIAGNOSIS — E785 Hyperlipidemia, unspecified: Secondary | ICD-10-CM | POA: Diagnosis not present

## 2024-05-23 DIAGNOSIS — R051 Acute cough: Secondary | ICD-10-CM | POA: Diagnosis not present

## 2024-05-23 DIAGNOSIS — J060 Acute laryngopharyngitis: Secondary | ICD-10-CM | POA: Diagnosis not present

## 2024-05-23 DIAGNOSIS — R0982 Postnasal drip: Secondary | ICD-10-CM | POA: Diagnosis not present

## 2024-05-23 DIAGNOSIS — R0981 Nasal congestion: Secondary | ICD-10-CM | POA: Diagnosis not present
